# Patient Record
Sex: Male | Born: 1955 | Race: White | Hispanic: No | Marital: Married | Smoking: Never smoker
Health system: Southern US, Community
[De-identification: ages and names within clinical notes are randomized; demographics above are authoritative.]

## PROBLEM LIST (undated history)

## (undated) DIAGNOSIS — Z9289 Personal history of other medical treatment: Secondary | ICD-10-CM

## (undated) DIAGNOSIS — I2510 Atherosclerotic heart disease of native coronary artery without angina pectoris: Secondary | ICD-10-CM

## (undated) DIAGNOSIS — E785 Hyperlipidemia, unspecified: Secondary | ICD-10-CM

## (undated) HISTORY — DX: Hyperlipidemia, unspecified: E78.5

## (undated) HISTORY — DX: Personal history of other medical treatment: Z92.89

## (undated) HISTORY — DX: Atherosclerotic heart disease of native coronary artery without angina pectoris: I25.10

---

## 2001-06-09 ENCOUNTER — Emergency Department (HOSPITAL_COMMUNITY): Admission: EM | Admit: 2001-06-09 | Discharge: 2001-06-09 | Payer: Self-pay | Admitting: Internal Medicine

## 2002-07-28 HISTORY — PX: CORONARY ARTERY BYPASS GRAFT: SHX141

## 2002-10-05 ENCOUNTER — Inpatient Hospital Stay (HOSPITAL_COMMUNITY): Admission: AD | Admit: 2002-10-05 | Discharge: 2002-10-16 | Payer: Self-pay | Admitting: Cardiovascular Disease

## 2002-10-05 ENCOUNTER — Encounter: Payer: Self-pay | Admitting: Cardiovascular Disease

## 2002-10-10 ENCOUNTER — Encounter: Payer: Self-pay | Admitting: Surgery

## 2002-10-11 ENCOUNTER — Encounter: Payer: Self-pay | Admitting: Surgery

## 2002-10-12 ENCOUNTER — Encounter: Payer: Self-pay | Admitting: Surgery

## 2002-11-08 ENCOUNTER — Encounter: Admission: RE | Admit: 2002-11-08 | Discharge: 2002-11-08 | Payer: Self-pay | Admitting: Surgery

## 2002-11-08 ENCOUNTER — Encounter: Payer: Self-pay | Admitting: Surgery

## 2002-11-14 ENCOUNTER — Encounter (HOSPITAL_COMMUNITY): Admission: RE | Admit: 2002-11-14 | Discharge: 2002-12-14 | Payer: Self-pay | Admitting: Cardiovascular Disease

## 2002-12-16 ENCOUNTER — Encounter (HOSPITAL_COMMUNITY): Admission: RE | Admit: 2002-12-16 | Discharge: 2003-01-15 | Payer: Self-pay | Admitting: Cardiovascular Disease

## 2003-01-18 ENCOUNTER — Encounter (HOSPITAL_COMMUNITY): Admission: RE | Admit: 2003-01-18 | Discharge: 2003-02-17 | Payer: Self-pay | Admitting: Cardiovascular Disease

## 2003-11-27 ENCOUNTER — Ambulatory Visit (HOSPITAL_COMMUNITY): Admission: RE | Admit: 2003-11-27 | Discharge: 2003-11-27 | Payer: Self-pay | Admitting: Family Medicine

## 2006-09-21 ENCOUNTER — Ambulatory Visit (HOSPITAL_COMMUNITY): Admission: RE | Admit: 2006-09-21 | Discharge: 2006-09-21 | Payer: Self-pay | Admitting: Internal Medicine

## 2006-09-21 ENCOUNTER — Ambulatory Visit: Payer: Self-pay | Admitting: Internal Medicine

## 2008-07-10 ENCOUNTER — Ambulatory Visit (HOSPITAL_COMMUNITY): Admission: RE | Admit: 2008-07-10 | Discharge: 2008-07-10 | Payer: Self-pay | Admitting: Family Medicine

## 2008-07-10 ENCOUNTER — Encounter: Payer: Self-pay | Admitting: Orthopedic Surgery

## 2008-07-19 ENCOUNTER — Ambulatory Visit (HOSPITAL_COMMUNITY): Admission: RE | Admit: 2008-07-19 | Discharge: 2008-07-19 | Payer: Self-pay | Admitting: Family Medicine

## 2008-07-31 ENCOUNTER — Ambulatory Visit: Payer: Self-pay | Admitting: Orthopedic Surgery

## 2008-07-31 DIAGNOSIS — M25569 Pain in unspecified knee: Secondary | ICD-10-CM | POA: Insufficient documentation

## 2008-07-31 DIAGNOSIS — M23302 Other meniscus derangements, unspecified lateral meniscus, unspecified knee: Secondary | ICD-10-CM | POA: Insufficient documentation

## 2008-07-31 DIAGNOSIS — M25469 Effusion, unspecified knee: Secondary | ICD-10-CM | POA: Insufficient documentation

## 2008-08-30 ENCOUNTER — Ambulatory Visit: Payer: Self-pay | Admitting: Orthopedic Surgery

## 2010-12-13 NOTE — Op Note (Signed)
NAME:  Strickland, William             ACCOUNT NO.:  [id number]   MEDICAL RECORD NO.:  [id number]          PATIENT TYPE:  AMB   LOCATION:  DAY                           FACILITY:  [facility]   PHYSICIAN:  R. Roetta Sessions, M.D. DATE OF BIRTH:  [birth year]   DATE OF PROCEDURE:  DATE OF DISCHARGE:                               OPERATIVE REPORT   DATE OF PROCEDURE:  September 21, 2006   INDICATIONS FOR PROCEDURE:  A 55 year old Caucasian male with no lower  GI tract symptoms sent over courtesy of Dr. Thalia Party to have colorectal  cancer screening.  He has never had his lower GI tract imaged and has  family history colorectal cancer.  Colonoscopy is now being done as  screening maneuver.  This approach has been discussed with the patient  at length.  Potential risks, benefits and alternatives have been  reviewed, questions answered.  Please see documentation in the medical  record.   PROCEDURE NOTE:  O2 saturation, blood pressure, pulse and respirations  were monitored during the entire procedure.  Conscious sedation with  Versed 4 milligrams IV and Demerol 100 milligrams IV in divided doses.   INSTRUMENT USED:  Pentax video chip system.   FINDINGS:  Digital rectal exam revealed no abnormalities.  The prep was  good.  The scope was advanced from the rectosigmoid junction through the  left transverse right colon  __________  ileocecal valve and cecum.  These structures well seen and photographed for the record.  From this  level the scope was slowly and cautiously withdrawn.  All previously  mentioned mucosal surfaces were again seen.  The colonic mucosa appeared  normal.  Scope was then pulled down the rectum with thorough examination  of the rectal mucosa.  Retroflex view of the anal verge revealed no  abnormalities.  Therapeutic/diagnostic maneuvers performed:  None.   The patient tolerated procedure well and was reactive during the  endoscopy.   IMPRESSION:  1. Normal rectum.  2. Normal  colon.   RECOMMENDATIONS:  Repeat screening colonoscopy in 10 years.      Jonathon Bellows, M.D.  Electronically Signed     RMR/MEDQ  D:  09/21/2006  T:  09/21/2006  Job:  [id number]   cc:   [name], MD  Fax: [phone]

## 2010-12-13 NOTE — Op Note (Signed)
NAME:  William Strickland, William Strickland                       ACCOUNT NO.:  [id number]   MEDICAL RECORD NO.:  [id number]                   PATIENT TYPE:  OIB   LOCATION:  2305                                 FACILITY:  [facility]   PHYSICIAN:  Evelene Croon, M.D.                  DATE OF BIRTH:  1956-04-07   DATE OF PROCEDURE:  10/10/2002  DATE OF DISCHARGE:                                 OPERATIVE REPORT   PREOPERATIVE DIAGNOSIS:  Severe three-vessel coronary artery disease with  unstable angina.   POSTOPERATIVE DIAGNOSIS:  Severe three-vessel coronary artery disease with  unstable angina.   PROCEDURES:  1. Median sternotomy.  2. Extracorporeal circulation.  3. Coronary artery bypass graft surgery x5 using a left internal mammary     artery graft to the left anterior descending coronary artery, a free     right internal mammary artery graft to the obtuse marginal branch of the     left circumflex coronary artery, a saphenous vein graft to the diagonal     branch of the left anterior descending, and a sequential saphenous vein     graft to the posterior descending and posterolateral branches of the     right coronary artery.  4. Endoscopic vein harvesting from the right leg.   SURGEON:  Evelene Croon, M.D.   ASSISTANT:  Toribio Harbour, R.N.   ANESTHESIA:  General endotracheal.   CLINICAL HISTORY:  This patient is a 55 year old gentleman with a history of  dyslipidemia and a very strong family history of premature coronary artery  disease, who presented with unstable anginal symptoms.  He underwent a  stress Cardiolite exam on 10/04/02, which was markedly positive.  He underwent  cardiac catheterization, which showed severe three-vessel disease.  There  was a long 90% mid-LAD stenosis.  There was a takeoff of the diagonal branch  in this area, which had about 90% stenosis.  The left circumflex gave off a  small first marginal and had about 80% stenosis and then a large second  marginal that  had 95% stenosis.  The right coronary artery was a large,  dominant vessel that had 99% midvessel stenosis.  There were 60 and 75%  distal stenoses before the posterior descending branch.  The posterior  descending branch itself had 70% proximal stenosis.  The distal right  coronary artery beyond the posterior descending had 95% stenosis before two  posterolateral branches, which were filled by collaterals from the left  circumflex.  Left ventricular ejection fraction was normal.  After review of  the angiogram and examination of the patient, it was felt that coronary  artery bypass graft surgery was the best treatment for this patient.  I felt  that it would be best to use bilateral internal mammary grafts if possible.  I discussed the operation with the patient and his wife and daughter,  including alternatives, benefits, and risks, including bleeding, blood  transfusion, infection, stroke, myocardial infarction, and death.  I also  discussed the increased incidence of sternal wound healing problems if we  used bilateral internal mammary grafts, although I felt this risk would be  minimally elevated since he was a nonsmoker, not diabetic, and not obese.  They understood and agreed with this plan.   DESCRIPTION OF PROCEDURE:  The patient was taken to the operating room and  placed on the table in the supine position.  After induction of general  endotracheal anesthesia, a Foley catheter was placed in the bladder using  sterile technique.  Then the chest, abdomen, and both lower extremities were  prepped and draped in the usual sterile manner.  The chest was entered  through a median sternotomy incision and the pericardium opened in the  midline.  Examination of the heart showed good ventricular contractility.  The ascending aorta had no palpable plaques in it.   Then the left internal mammary artery was harvested from the chest wall as a  pedicle graft.  This was a medium to  large-caliber vessel with excellent  blood flow through it.  Then the right internal mammary artery was harvested  as a pedicle graft.  This artery was also of medium to large caliber and  good quality.  I did not feel that the length would be sufficient to reach  over to the obtuse marginal branch, even going through the transverse sinus.  I did measure this distance and it would not be long enough as a pedicle  graft and therefore was harvested as a free graft.  At the same time a  segment of greater saphenous vein was harvested from the right leg using  endoscopic vein harvest technique.  This vein was of large caliber and good  quality.   Then the patient was heparinized and when an adequate activated clotting  time was achieved, the distal ascending aorta was cannulated using a 20  French aortic cannula for arterial inflow.  Venous outflow was achieved  using a two-stage venous cannula through the right atrial appendage.  An  antegrade cardioplegia and vent cannula was inserted in the aortic root.   The patient was placed on cardiopulmonary bypass and the distal coronary  arteries identified.  The LAD was a large, graftable vessel that was heavily  diseased in its proximal portion.  This disease extended down into the  midportion.  The patient had three diagonal branches.  The first and third  were small, and the second one was large enough to graft.  The obtuse  marginal branch was a large vessel that had some proximal disease in it.  The first marginal was a tiny, nongraftable vessel.  The right coronary  artery gave off medium-sized posterior descending and posterolateral  branches.   Then the aorta was crossclamped and 500 mL of cold blood antegrade  cardioplegia was administered in the aortic root with quick arrest of the  heart.  Systemic hypothermia to 28 degrees Centigrade and topical hypothermia with iced saline was used.  A temperature probe was placed in  the septum and  an insulating pad in the pericardium.   The first distal anastomosis was performed to the posterior descending  coronary artery.  The internal diameter was 1.75 mm.  The conduit used was a  segment of greater saphenous vein and the anastomosis performed in a  sequential Steri-Strips manner using continuous 7-0 Prolene suture.  Flow  was measured through the graft and was excellent.  The second distal anastomosis was performed to the posterolateral branch.  The internal diameter was about 1.6 mm.  The conduit used was the same  segment of greater saphenous vein and the anastomosis performed in a  sequential end-to-side manner using continuous 7-0 Prolene suture.  Flow was  measured through the graft and was excellent.  Then a dose of cardioplegia  was given down this vein graft and in the aortic root.   The third distal anastomosis was performed to the obtuse marginal branch.  The internal diameter was about 2 mm.  The conduit used was the free right  internal mammary graft, and this was anastomosed in an end-to-side manner  using continuous 8-0 Prolene suture.  The pedicle was tacked to the  epicardium with 6-0 Prolene sutures.   The fourth distal anastomosis was performed to the diagonal branch.  The  internal diameter of the second diagonal was about 1.6 mm.  The conduit used  was the second segment of greater saphenous vein and the anastomosis  performed in an end-to-side manner using continuous 7-0 Prolene suture.  Flow was measured through the graft and was excellent.  Then another dose of  cardioplegia was given down the vein grafts and in the aortic root.   The fifth distal anastomosis was performed to the distal portion of the left  anterior descending coronary artery.  The internal diameter was about 2 mm.  The conduit used was the left internal mammary graft, and this was brought  through an opening in the left pericardium anterior to the phrenic nerve.  It was anastomosed  to the LAD in an end-to-side manner using continuous 8-0  Prolene suture.  The pedicle was tacked to the epicardium with 6-0 Prolene  sutures.  The patient was rewarmed to 37 degrees Centigrade and the clamp  removed from the mammary pedicle.  There was rapid warming of the  ventricular septum and return of spontaneous ventricular fibrillation.  The  crossclamp was removed with a time of 81 minutes and the patient  defibrillated into sinus rhythm.   The partial occlusion clamp was placed in the aortic root and the two  proximal vein graft anastomoses were performed in an end-to-side manner  using continuous 6-0 Prolene suture.  The proximal anastomosis of the right  internal mammary free graft was performed to the diagonal vein graft in an  end-to-side manner using continuous 7-0 Prolene suture.  The clamp was  removed, the vein graft de-aired, and the clamps removed from them.  The proximal and distal anastomoses appeared hemostatic and the alignment of the  grafts satisfactory.  Graft markers were placed around the proximal  anastomoses.  Two temporary right ventricular and right atrial pacing wires  were placed and brought out through the skin.   When the patient had rewarmed to 37 degrees Centigrade, he was weaned from  cardiopulmonary bypass on no inotropic agents.  Total bypass time was 151  minutes.  Cardiac function appeared excellent with a cardiac output of 7  L/min.  Protamine was given and the venous and aortic cannulas were removed  without difficulty.  Hemostasis was achieved.  Four chest tubes were placed  with bilateral pleural tubes, a tube in the posterior pericardium, and one  in the anterior mediastinum.  The pericardium was reapproximated over the  heart.  The sternum closed with #6 stainless steel wires.  The fascia was  closed with continuous #1 Vicryl suture.  The subcutaneous tissue was closed  using  continuous 2-0 Vicryl and the skin with 3-0 Vicryl  subcuticular  closure.  The lower extremity vein harvest site was closed in layers in a  similar manner.  The sponge, needle, and instrument counts were correct  according to the scrub nurse.  Dry sterile dressings were applied over the  incisions and around the chest tubes, which were hooked to Pleuravac  suction.  The patient remained hemodynamically stable and was transported to  the SICU in guarded but stable condition.                                               Evelene Croon, M.D.    BB/MEDQ  D:  10/10/2002  T:  10/11/2002  Job:  [id number]   cc:   Nanetta Batty, M.D.  [address]  [place]  [place] [address]  Fax: [phone]   [facility]

## 2010-12-13 NOTE — Cardiovascular Report (Signed)
NAME:  William Strickland, William Strickland NO.:  [id number]   MEDICAL RECORD NO.:  [id number]                   PATIENT TYPE:  OIB   LOCATION:  2899                                 FACILITY:  [facility]   PHYSICIAN:  Nanetta Batty, M.D.                DATE OF BIRTH:  1956/07/11   DATE OF PROCEDURE:  10/05/2002  DATE OF DISCHARGE:                              CARDIAC CATHETERIZATION   PROCEDURE PERFORMED:  Cardiac catheterization.   INDICATION:  The patient is a 55 year old married white male, who has had  accelerated angina and a positive Cardiolite stress test.  He has a strong  family history of heart disease.  He presents now for diagnostic coronary  arteriography.   DESCRIPTION OF PROCEDURE:  The patient was brought to the second floor [facility] in the postabsorptive state.  He was  premedicated with p.o. Valium and IV Versed.  His right groin was prepped  and shaved in the usual sterile fashion.  Xylocaine 1% was used for local  anesthesia.  A 6 French sheath was inserted into the right femoral artery  using standard Seldinger technique. A 6 French right and left Judkins  diagnostic catheter as well as a 6 French pigtail catheter were used for  selective coronary angiography, left ventriculography, subselective right  and left internal mammary artery angiography, and distal abdominal  aortography.  Omnipaque dye was used for the entirety of the case.  Retrograde, aortic, left ventricular, and pullback pressures were recorded.   HEMODYNAMICS:  1. Aortic systolic pressure 111, diastolic pressure 74.  2. Left ventricular systolic pressure 111, diastolic pressure 11.   SELECTIVE CORONARY ANGIOGRAPHY:  1. Left main:  Normal.  2. Left anterior descending:  The LAD had diffuse segmental 60% mid stenosis     with focal 80% at the distal edge of the disease segment.  A second     diagonal branch is a small vessel and had 90% ostial  stenosis.  3. Ramus intermedius branch:  This is a large bifurcating vessel that had a     95% proximal stenosis.  4. Right coronary artery:  This is a large dominant vessel with a 99%     stenosis to the midportion with a proximal filling defect followed by 60-     75% segmental stenosis distally.  There was a 70% proximal segmental PDA     stenosis and 95% proximal PLA stenosis.  There was left to right     collaterals.   LEFT VENTRICULOGRAM:  RAO left ventriculogram was performed using 25 mL of  Omnipaque dye at 12 mL/sec.  The overall LVEF was estimated at greater than  60% without focal wall motion abnormalities.   Right and left internal mammary arteries:  These were subselectively  visualized and were widely patent.  Both were suitable for coronary artery  bypass grafting.   DISTAL AORTOGRAPHY:  This  was performed using 20 mL of Omnipaque at 20  mL/sec.  The renal arteries are widely patent.  The infrarenal abdominal  aorta and iliac bifurcation appeared free of significant atherosclerotic  changes.   IMPRESSION:  The patient has diffuse coronary artery disease involving all  three vessels with preserved left ventricular function, accelerated angina,  positive Cardiolite stress test.   He will need coronary artery bypass grafting for complete revascularization.  He is a good candidate for a complete arterial conduit operation.   The sheaths were removed and pressure was held on the groin to achieve  hemostasis.  The patient left the lab in stable condition. [facility] was notified  and Drs. Laneta Simmers and Ripley were requested.  Dr. Renard Matter was notified as  well.                                                  Nanetta Batty, M.D.    Cordelia Pen  D:  10/05/2002  T:  10/05/2002  Job:  [id number]   cc:   Cardiac Catheterization Lab   [name], M.D.  [address]  [place]  [place] [address]  Fax: [phone]   [facility]  [address]  [place], [place] [address]

## 2010-12-13 NOTE — Discharge Summary (Signed)
NAME:  William Strickland, William Strickland                       ACCOUNT NO.:  [id number]   MEDICAL RECORD NO.:  [id number]                   PATIENT TYPE:  OIB   LOCATION:  2002                                 FACILITY:  [facility]   PHYSICIAN:  Evelene Croon, M.D.                  DATE OF BIRTH:  23-Mar-1956   DATE OF ADMISSION:  10/05/2002  DATE OF DISCHARGE:  10/16/2002                                 DISCHARGE SUMMARY   ADDITIONAL DIAGNOSIS:  Coronary artery disease.   PAST MEDICAL HISTORY:  1. History of chest pain followed with regular stress testing.  Last one     12/00 was negative.  2. Dyslipidemia.  3. Strong family history of coronary artery disease.   ALLERGIES:  No known drug allergies.   DISCHARGE DIAGNOSIS:  Severe two vessel coronary artery disease with  unstable angina status post coronary artery bypass grafting.   HISTORY OF PRESENT ILLNESS:  The patient is a 55 year old Caucasian male.  He returned to [facility] on 09/30/02  after several episodes of chest tightness.  Prior to this visit, he had last  seen Dr. Allyson Sabal in 12/00 when he had a normal stress test.  He was scheduled  for a Cardiolite stress test which was performed on 10/04/02.  He was able to  finish the exercise portion of the test but complained of pain being 6 on a  scale from 0-10.  He was evaluated that day by Dr. Nicki Guadalajara who  recommended admission to [facility] for elective coronary  angiography.   HOSPITAL COURSE:  On 10/04/02, the patient was admitted to [facility]  under the care of Dr. Nanetta Batty.  He underwent a cardiac  catheterization on 10/05/02 which revealed severe three vessel coronary  artery disease.  As these lesions were not amenable to PCI, cardiac surgery  consult was requested.  He was evaluated later in the day by Dr. Evelene Croon.  After examination of patient and review of the patient's charts,  including cardiac catheterization films,  Dr. Laneta Simmers recommended proceeding  with coronary artery bypass grafting as the best treatment for this young  man.  Procedure, risks and benefits were discussed with the patient, and he  agreed with this plan.  He was scheduled for surgery on 10/10/02.   Preoperative Doppler studies revealed no significant carotid artery disease,  his ABI's were greater than 1.0 bilaterally.  He remained stable in the  hospital on heparin drip until surgery.   On 10/10/02, the patient underwent the following surgical procedures with Dr.  Evelene Croon:  Coronary artery bypass grafting x5.  Grafts placed at the  time of the procedure were a left internal mammary arteriograph to the left  anterior descending artery.  Free right internal mammary arteriograph to the  circumflex artery, saphenous vein graft to the diagonal artery, saphenous  vein graft in the  sequential fashion to the posterior descending and  posterior lateral arteries.  Vein was harvested from the right thigh with  endovenous harvesting technique.  He tolerated this procedure well and was  transferred in stable condition to the surgical intensive care unit.  He  remained hemodynamically stable in immediate postoperative period.  He was  extubated several hours after arrival in the [facility] ICU.    POSTOPERATIVE DIAGNOSES:  1. Postoperative rapid atrial fibrillation.  This first occurred on 10/12/02.     This is postoperative day #2.  His rate was in the 160-190 range.     Amiodarone was initiated.  He converted to normal sinus rhythm on IV     Amiodarone and Lopressor.  However, he had several episodes of back and     forth between atrial fibrillation and normal sinus rhythm.  For this     reason, he was started on anticoagulation with Lovenox.  This was     eventually changed over to Coumadin.  He converted to normal sinus rhythm     on 3/18 and has maintained that rhythm since.  2. Pericardial rub was noticed on 10/13/02, postop day #3.  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  3. The morning of 10/15/02, postop day #5, the patient reports feeling very     well.  His vital signs are stable with blood pressure of 100/55,     afebrile.  His room air saturations are 99%.  His heart is in normal     sinus rhythm at approximately 77 beats per minute.  No pericardial rub is     noted.  His lungs are clear.  He is tolerating his diet well.  His bowel     and bladder functions are within normal limits for him.  His incisions     are all healing well.  He does have quite a bit of ecchymosis of his     right thigh.  He is ambulating independently in his room. H is pain is     well controlled.  He is sleeping okay.  The patient is making very good     progress and recovering from his surgery.  If he continues this progress,     it is anticipated he will be ready for discharge home tomorrow 10/16/02.   LABORATORY DATA:  On 10/15/02, chemistries revealed sodium 135, potassium  3.8, BUN 18, creatinine 1.0, glucose 106, calcium 8.4.  PT 17.1, INR 1.5.  CBC on 3/17 revealed white blood cell counts 11.2, hemoglobin 11.3,  hematocrit 32.9, platelets 208.   CONDITION ON DISCHARGE:  Improved.   DISCHARGE MEDICATIONS:  1. Amiodarone 200 mg p.o. daily.  2. Coumadin discharge dose to be determined prior to discharge.  3. Zocor 40 mg p.o. daily.  4. Lopressor 25 mg p.o. q.12h.  5. He is to resume his aspirin 81 mg p.o. daily.  6. Tylox 1-2 p.o. q.4-6h. p.r.n. moderate to severe pain.  7. Tylenol 325 mg 1-2 p.o. q.4-6h. p.r.n. mild pain.   ACTIVITY:  He has been asked to refrain from any driving or any heavy  lifting, pushing or pulling.  He is asked to continue his preadmission  exercises and daily walking.   DIET:  Low-fat, low-salt diet.   WOUND CARE:  He may shower with mild soap and water.  If his incision becomes red, hot, swollen, draining or develops a fever greater  than 100.1,   he is to call Dr. Sharee Pimple office.   FOLLOWUP:  He will have PT/INR blood work checked at [facility] Tuesday 3/23.  He will be asked to call to arrange that  appointment time.  Dr. Allyson Sabal would like to see him at the office in  approximately two weeks.  He is also managed to call to arrange that  appointment.  Dr. Laneta Simmers would like to see him at the CVTS office in three  weeks.  Our office will call with a date and time for that appointment.  He  will be asked to have a chest x-ray and 2-D echo one hour prior to his  appointment.     Toribio Harbour, R.N.                  Evelene Croon, M.D.    CTK/MEDQ  D:  10/15/2002  T:  10/17/2002  Job:  [id number]   cc:   [name], M.D.  [address]  [place]  [place] [address]  Fax: [phone]

## 2010-12-13 NOTE — Consult Note (Signed)
NAME:  William Strickland, William Strickland                         ACCOUNT NO.:  [id number]   MEDICAL RECORD NO.:  [id number]                   PATIENT TYPE:  OIB   LOCATION:  4737                                 FACILITY:  [facility]   PHYSICIAN:  Evelene Croon, M.D.                  DATE OF BIRTH:  September 22, 1955   DATE OF CONSULTATION:  10/05/2002  DATE OF DISCHARGE:                                   CONSULTATION   REASON FOR CONSULTATION:  Severe three-vessel coronary artery disease.   HISTORY OF PRESENT ILLNESS:  The patient is a 55 year old gentleman with a  history of dyslipidemia and a very strong family history of premature  coronary artery disease who recently developed chest tightness.  This was  substernal and severe and associated with shortness of breath.  He rated the  pain as 5/10.  He took four baby aspirin and the pain resolved.  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  This was associated with shortness of breath.  He  continued working and it eventually went away.  He continued to have  intermittent episodes of pain the following day.  He was seen in Dr. Hazle Coca  office on 09/30/02.  He was scheduled for a Cardiolite scan on 10/04/02 which  was positive with ST depression and shortness of breath as well as some  chest pain rated 6/10.  He was admitted and underwent coronary angiography  today which showed severe three-vessel coronary disease.  There is a long  90% mid LAD stenosis.  There is a take-off of a diagonal branch in this area  with about 90% stenosis.  The left circumflex gave off a small first  marginal that had about 80% stenosis and then a large second marginal which  had 95% stenosis.  The right coronary artery was a large dominant vessel  that had 99% mid vessel stenosis.  There were 60 and 75%  distal stenoses  before the posterior descending branch.  The posterior descending had 70%  proximal stenosis.  The distal right  beyond the posterior descending had 95%  stenosis before to posterior lateral branches which were filled by  collaterals from the left circumflex.  Left ventricular ejection fraction  was normal.  Both internal mammary arteries were large vessels.   MEDICATIONS:  1. Aspirin 81 mg daily.  2. Nitroglycerin p.r.n.  3. Prevacid 30 mg daily.  4. Vitamin E 400 units daily.   ALLERGIES:  None.   FAMILY HISTORY:  Strongly positive for premature coronary disease.  Essentially, all of his male relatives have had coronary disease and either  died of myocardial infarction or had coronary bypass surgery.  One brother  age 68 had coronary bypass surgery.  His mother died of heart attack at age  38.  One cousin died of myocardial infarction at age 38 and uncle  died of  myocardial infarction at 36.   PAST MEDICAL HISTORY:  He has a history of chest pain in the past and has  been followed with regular stress testing.  The last one was in 12/00 and  was apparently negative.  He had not been seen since that.  He has had no  previous hospitalizations. He does have dyslipidemia.   SOCIAL HISTORY:  He is married and works as a Land.  He has three  children.  He exercises every day on a treadmill.  He has never smoked and  does not abuse alcohol.   REVIEW OF SYSTEMS:  GENERAL:  He has had some decrease in his energy level.  He denies any fever or chills.  He has had no weight loss.  EYES:  Negative.  ENT:  Negative.  ENDOCRINE:  He denies diabetes and hypothyroidism.  CARDIOVASCULAR:  As above.  He has had no PND or orthopnea.  He has had  exertional chest pain and shortness of breath.  RESPIRATORY:  He denies  cough and sputum production.  GI:  He has had no nausea or vomiting.  Denies  melena and bright red blood per rectum.  GU:  He denies dysuria and  hematuria.  NEUROLOGIC:  He has had no focal weakness or numbness.  He  denies dizziness and syncope.  MUSCULOSKELETAL:  He has had no  history of  arthralgias or myalgias.  VASCULAR:  He has had no claudication or  phlebitis.  PSYCHIATRIC:  Negative.   PHYSICAL EXAMINATION:  VITAL SIGNS:  Blood pressure 125/65 and  his pulse is  72 and regular. Respiratory rate 16, unlabored.  GENERAL:  He is a large, muscular, well-developed white male in no distress.  HEENT:  Shows normocephalic, atraumatic.  Pupils are equal and reactive to  light and accommodation.  Extraocular muscles are intact.  His throat is  clear.  NECK:  Shows normal carotid pulses bilaterally.  There are no bruits.  There  is no adenopathy or thyromegaly.  CARDIAC:  Regular  rate and rhythm with normal S1 and S2.  There is no  murmurs, rubs or gallops.  LUNGS:  Clear.  ABDOMEN:  Active bowel sounds.  His abdomen is soft and nontender.  There  are no palpable masses or organomegaly.  EXTREMITIES:  No peripheral edema.  Pedal pulses are palpable bilaterally.  NEUROLOGIC:  Alert and oriented x3.  Motor and sensory exam is grossly  normal.  SKIN:  Warm and dry.   IMPRESSION:  The patient has severe three-vessel coronary disease with  unstable angina and positive stress test.  I agree that coronary artery  bypass graft surgery  using both internal mammary arteries is the best  treatment for this young patient.  I discussed the operative procedure with  he and his family including alternatives, benefits, and risks including  bleeding, blood transfusion, infection, stroke, myocardial infarction, and  death.  I also discussed the possibility of sternal nonunion and wound  healing problems due to using both internal mammary arteries although I  think that risk is very low in this otherwise healthy patient.  They  understand all this and agree to proceed.  We will plan to do surgery on  Monday 10/10/02.  Evelene Croon, M.D.   BB/MEDQ  D:  10/05/2002  T:  10/06/2002  Job:  [id number]   cc:   [name], M.D.   [address]  [place]  [place] [address]  Fax: [phone]

## 2012-12-21 ENCOUNTER — Other Ambulatory Visit: Payer: Self-pay | Admitting: *Deleted

## 2012-12-21 MED ORDER — NIACIN ER (ANTIHYPERLIPIDEMIC) 1000 MG PO TBCR: 1000.0000 mg | EXTENDED_RELEASE_TABLET | Freq: Every day | ORAL | Status: DC

## 2012-12-21 NOTE — Telephone Encounter (Signed)
RX for niacin

## 2013-01-10 ENCOUNTER — Other Ambulatory Visit: Payer: Self-pay | Admitting: *Deleted

## 2013-01-10 MED ORDER — ATORVASTATIN CALCIUM 80 MG PO TABS: 80.0000 mg | ORAL_TABLET | Freq: Every day | ORAL | Status: DC

## 2013-02-02 ENCOUNTER — Encounter: Payer: Self-pay | Admitting: Cardiovascular Disease

## 2013-02-02 ENCOUNTER — Ambulatory Visit (INDEPENDENT_AMBULATORY_CARE_PROVIDER_SITE_OTHER): Payer: BC Managed Care – PPO | Admitting: Cardiovascular Disease

## 2013-02-02 VITALS — BP 122/78 | HR 92 | Ht 72.0 in | Wt 249.2 lb

## 2013-02-02 DIAGNOSIS — E785 Hyperlipidemia, unspecified: Secondary | ICD-10-CM

## 2013-02-02 DIAGNOSIS — I2510 Atherosclerotic heart disease of native coronary artery without angina pectoris: Secondary | ICD-10-CM | POA: Insufficient documentation

## 2013-02-02 NOTE — Progress Notes (Signed)
02/02/2013 William Strickland   04-28-1956  [id number]  Primary Physician Alice Reichert, MD Primary Cardiologist: [name] MD Roseanne Reno   HPI:  William Strickland is a 57 year old mildly overweight widowed Caucasian male father of 3 children, grandfather to 6 grandchildren who I last saw in the office/18/13. He has a history of CAD status post coronary artery bypass grafting in 2004 with an LIMA graft to the LAD, RIMA graft to an OM branch, a vein to diagonal branch, and a sequential vein to the PDA/PL A. His other problems include hyperlipidemia. He had an excellent lipid profile May of this year. He had a nonischemic Myoview in November of last year. He gets occasional atypical chest pain.   Current Outpatient Prescriptions  Medication Sig Dispense Refill  . aspirin 81 MG tablet Take 81 mg by mouth daily.      Marland Kitchen atorvastatin (LIPITOR) 80 MG tablet Take 1 tablet (80 mg total) by mouth daily.  30 tablet  1  . metoprolol (LOPRESSOR) 50 MG tablet Take 25 mg by mouth 2 (two) times daily.      . niacin (NIASPAN) 1000 MG CR tablet Take 1 tablet (1,000 mg total) by mouth at bedtime.  90 tablet  0  . VIAGRA 100 MG tablet Take 100 mg by mouth as needed.       No current facility-administered medications for this visit.    No Known Allergies  History   Social History  . Marital Status: Widowed    Spouse Name: N/A    Number of Children: 3  . Years of Education: N/A   Occupational History  . Not on file.   Social History Main Topics  . Smoking status: Never Smoker   . Smokeless tobacco: Never Used  . Alcohol Use: No  . Drug Use: No  . Sexually Active: Yes -- Male partner(s)   Other Topics Concern  . Not on file   Social History Narrative  . No narrative on file     Review of Systems: General: negative for chills, fever, night sweats or weight changes.  Cardiovascular: negative for chest pain, dyspnea on exertion, edema, orthopnea, palpitations, paroxysmal  nocturnal dyspnea or shortness of breath Dermatological: negative for rash Respiratory: negative for cough or wheezing Urologic: negative for hematuria Abdominal: negative for nausea, vomiting, diarrhea, bright red blood per rectum, melena, or hematemesis Neurologic: negative for visual changes, syncope, or dizziness All other systems reviewed and are otherwise negative except as noted above.    Blood pressure 122/78, pulse 92, height 6' (1.829 m), weight 249 lb 3.2 oz (113.036 kg).  General appearance: alert and no distress Neck: no adenopathy, no carotid bruit, no JVD, supple, symmetrical, trachea midline and thyroid not enlarged, symmetric, no tenderness/mass/nodules Lungs: clear to auscultation bilaterally Heart: regular rate and rhythm, S1, S2 normal, no murmur, click, rub or gallop Extremities: extremities normal, atraumatic, no cyanosis or edema  EKG normal sinus rhythm at 92 with nonspecific ST and T wave changes  ASSESSMENT AND PLAN:   Coronary artery disease Status post coronary artery bypass grafting in 2004 with an LIMA graft to his LAD, patent RIMA graft to an OM branch, a vein to the diagonal branch and a sequential vein to the PDA/PL A. He had a nuclear stress test performed in November of last year which was normal suggesting continued graft patency. Complaints of atypical/noncardiac chest pain occurs several times a month.  Hyperlipidemia On statin therapy with excellent profile performed as recently as  12/08/12 with a total cholesterol of 100, LDL of 43 and HDL of 32.      [name] MD [facility], [facility] 02/02/2013 10:39 AM

## 2013-02-02 NOTE — Assessment & Plan Note (Signed)
Status post coronary artery bypass grafting in 2004 with an LIMA graft to his LAD, patent RIMA graft to an OM branch, a vein to the diagonal branch and a sequential vein to the PDA/PL A. He had a nuclear stress test performed in November of last year which was normal suggesting continued graft patency. Complaints of atypical/noncardiac chest pain occurs several times a month.

## 2013-02-02 NOTE — Assessment & Plan Note (Signed)
On statin therapy with excellent profile performed as recently as 12/08/12 with a total cholesterol of 100, LDL of 43 and HDL of 32.

## 2013-02-02 NOTE — Patient Instructions (Addendum)
Your physician recommends that you schedule a follow-up appointment in: 1 year  

## 2013-02-03 ENCOUNTER — Encounter: Payer: Self-pay | Admitting: Cardiovascular Disease

## 2013-03-25 ENCOUNTER — Other Ambulatory Visit: Payer: Self-pay | Admitting: *Deleted

## 2013-03-25 MED ORDER — ATORVASTATIN CALCIUM 80 MG PO TABS: 80.0000 mg | ORAL_TABLET | Freq: Every day | ORAL | Status: DC

## 2013-03-25 NOTE — Telephone Encounter (Signed)
Rx was sent to pharmacy electronically. 

## 2013-04-07 ENCOUNTER — Other Ambulatory Visit: Payer: Self-pay | Admitting: *Deleted

## 2013-04-07 MED ORDER — NIACIN ER (ANTIHYPERLIPIDEMIC) 1000 MG PO TBCR: 1000.0000 mg | EXTENDED_RELEASE_TABLET | Freq: Every day | ORAL | Status: DC

## 2013-04-07 NOTE — Telephone Encounter (Signed)
Niacin refilled electronically #90 w/1 refill.

## 2013-06-27 HISTORY — PX: CARDIAC CATHETERIZATION: SHX172

## 2013-07-08 ENCOUNTER — Encounter (HOSPITAL_COMMUNITY): Payer: Self-pay | Admitting: Cardiology

## 2013-07-08 ENCOUNTER — Encounter (HOSPITAL_COMMUNITY)
Admission: AD | Disposition: A | Discharge status: Home / Self Care | Payer: Self-pay | Source: Ambulatory Visit | Attending: Cardiovascular Disease

## 2013-07-08 ENCOUNTER — Encounter: Payer: Self-pay | Admitting: Cardiology

## 2013-07-08 ENCOUNTER — Observation Stay (HOSPITAL_COMMUNITY)
Admission: AD | Admit: 2013-07-08 | Discharge: 2013-07-09 | DRG: 287 | Disposition: A | Discharge status: Home / Self Care | Payer: BC Managed Care – PPO | Source: Ambulatory Visit | Attending: Cardiovascular Disease | Admitting: Cardiovascular Disease

## 2013-07-08 ENCOUNTER — Ambulatory Visit (INDEPENDENT_AMBULATORY_CARE_PROVIDER_SITE_OTHER): Payer: BC Managed Care – PPO | Admitting: Cardiology

## 2013-07-08 VITALS — BP 100/80 | HR 73 | Ht 72.0 in | Wt 253.4 lb

## 2013-07-08 DIAGNOSIS — E785 Hyperlipidemia, unspecified: Secondary | ICD-10-CM | POA: Diagnosis present

## 2013-07-08 DIAGNOSIS — R079 Chest pain, unspecified: Secondary | ICD-10-CM | POA: Diagnosis present

## 2013-07-08 DIAGNOSIS — I200 Unstable angina: Secondary | ICD-10-CM

## 2013-07-08 DIAGNOSIS — Z8249 Family history of ischemic heart disease and other diseases of the circulatory system: Secondary | ICD-10-CM

## 2013-07-08 DIAGNOSIS — Z23 Encounter for immunization: Secondary | ICD-10-CM

## 2013-07-08 DIAGNOSIS — I2510 Atherosclerotic heart disease of native coronary artery without angina pectoris: Secondary | ICD-10-CM

## 2013-07-08 DIAGNOSIS — Z951 Presence of aortocoronary bypass graft: Secondary | ICD-10-CM

## 2013-07-08 DIAGNOSIS — R0789 Other chest pain: Secondary | ICD-10-CM | POA: Diagnosis not present

## 2013-07-08 HISTORY — PX: LEFT HEART CATHETERIZATION WITH CORONARY ANGIOGRAM: SHX5451

## 2013-07-08 LAB — CBC
HCT: 40.8 % (ref 39.0–52.0)
MCH: 27.4 pg (ref 26.0–34.0)
MCHC: 34.3 g/dL (ref 30.0–36.0)
MCV: 79.8 fL (ref 78.0–100.0)
RDW: 12.7 % (ref 11.5–15.5)

## 2013-07-08 LAB — TSH: TSH: 1.195 u[IU]/mL (ref 0.350–4.500)

## 2013-07-08 LAB — BASIC METABOLIC PANEL
BUN: 12 mg/dL (ref 6–23)
CO2: 27 mEq/L (ref 19–32)
Chloride: 102 mEq/L (ref 96–112)
Creatinine, Ser: 0.92 mg/dL (ref 0.50–1.35)
GFR calc non Af Amer: >90 mL/min (ref >90)
Glucose, Bld: 81 mg/dL (ref 70–99)

## 2013-07-08 LAB — PROTIME-INR: INR: 1.13 (ref 0.00–1.49)

## 2013-07-08 LAB — TROPONIN I: Troponin I: <0.3 ng/mL (ref <0.30)

## 2013-07-08 SURGERY — LEFT HEART CATHETERIZATION WITH CORONARY ANGIOGRAM
Anesthesia: LOCAL

## 2013-07-08 MED ORDER — DIAZEPAM 5 MG PO TABS
5.0000 mg | ORAL_TABLET | ORAL | Status: AC
Start: 2013-07-08 — End: 2013-07-08
  Administered 2013-07-08: 5 mg via ORAL
  Filled 2013-07-08: qty 1

## 2013-07-08 MED ORDER — ONDANSETRON HCL 4 MG/2ML IJ SOLN: 4.0000 mg | Freq: Four times a day (QID) | INTRAMUSCULAR | Status: DC | PRN

## 2013-07-08 MED ORDER — ASPIRIN 81 MG PO CHEW
81.0000 mg | CHEWABLE_TABLET | Freq: Every day | ORAL | Status: DC
  Filled 2013-07-08: qty 1

## 2013-07-08 MED ORDER — ALPRAZOLAM 0.25 MG PO TABS: 0.2500 mg | ORAL_TABLET | Freq: Two times a day (BID) | ORAL | Status: DC | PRN

## 2013-07-08 MED ORDER — ASPIRIN 81 MG PO TABS: 81.0000 mg | ORAL_TABLET | Freq: Every day | ORAL | Status: DC

## 2013-07-08 MED ORDER — SODIUM CHLORIDE 0.9 % IJ SOLN: 3.0000 mL | INTRAMUSCULAR | Status: DC | PRN

## 2013-07-08 MED ORDER — ATORVASTATIN CALCIUM 80 MG PO TABS
80.0000 mg | ORAL_TABLET | Freq: Every day | ORAL | Status: DC
  Filled 2013-07-08 (×2): qty 1

## 2013-07-08 MED ORDER — SODIUM CHLORIDE 0.9 % IV SOLN: 250.0000 mL | INTRAVENOUS | Status: DC | PRN

## 2013-07-08 MED ORDER — SODIUM CHLORIDE 0.9 % IV SOLN
1.0000 mL/kg/h | INTRAVENOUS | Status: AC
  Administered 2013-07-08: 1 mL/kg/h via INTRAVENOUS

## 2013-07-08 MED ORDER — ACETAMINOPHEN 325 MG PO TABS
650.0000 mg | ORAL_TABLET | ORAL | Status: DC | PRN
  Administered 2013-07-09: 06:00:00 650 mg via ORAL
  Filled 2013-07-08: qty 2

## 2013-07-08 MED ORDER — NITROGLYCERIN 0.4 MG SL SUBL: 0.4000 mg | SUBLINGUAL_TABLET | SUBLINGUAL | Status: DC | PRN

## 2013-07-08 MED ORDER — NITROGLYCERIN 2 % TD OINT
1.0000 [in_us] | TOPICAL_OINTMENT | Freq: Four times a day (QID) | TRANSDERMAL | Status: DC
  Administered 2013-07-08 – 2013-07-09 (×3): 1 [in_us] via TOPICAL
  Filled 2013-07-08: qty 30

## 2013-07-08 MED ORDER — ASPIRIN 81 MG PO CHEW: 81.0000 mg | CHEWABLE_TABLET | ORAL | Status: DC

## 2013-07-08 MED ORDER — ASPIRIN EC 81 MG PO TBEC: 81.0000 mg | DELAYED_RELEASE_TABLET | Freq: Every day | ORAL | Status: DC

## 2013-07-08 MED ORDER — HEPARIN (PORCINE) IN NACL 100-0.45 UNIT/ML-% IJ SOLN
1400.0000 [IU]/h | INTRAMUSCULAR | Status: DC
  Administered 2013-07-08: 1400 [IU]/h via INTRAVENOUS
  Filled 2013-07-08 (×2): qty 250

## 2013-07-08 MED ORDER — SODIUM CHLORIDE 0.9 % IV SOLN
INTRAVENOUS | Status: DC
  Administered 2013-07-08: 13:00:00 via INTRAVENOUS

## 2013-07-08 MED ORDER — NIACIN ER (ANTIHYPERLIPIDEMIC) 1000 MG PO TBCR: 1000.0000 mg | EXTENDED_RELEASE_TABLET | Freq: Every day | ORAL | Status: DC

## 2013-07-08 MED ORDER — HEPARIN BOLUS VIA INFUSION
4000.0000 [IU] | Freq: Once | INTRAVENOUS | Status: AC
  Administered 2013-07-08: 4000 [IU] via INTRAVENOUS
  Filled 2013-07-08: qty 4000

## 2013-07-08 MED ORDER — SODIUM CHLORIDE 0.9 % IJ SOLN
3.0000 mL | Freq: Two times a day (BID) | INTRAMUSCULAR | Status: DC
  Administered 2013-07-08: 3 mL via INTRAVENOUS

## 2013-07-08 MED ORDER — ASPIRIN 81 MG PO CHEW
324.0000 mg | CHEWABLE_TABLET | Freq: Once | ORAL | Status: AC
  Administered 2013-07-08: 324 mg via ORAL

## 2013-07-08 MED ORDER — ZOLPIDEM TARTRATE 5 MG PO TABS: 5.0000 mg | ORAL_TABLET | Freq: Every evening | ORAL | Status: DC | PRN

## 2013-07-08 MED ORDER — HEPARIN (PORCINE) IN NACL 2-0.9 UNIT/ML-% IJ SOLN
INTRAMUSCULAR | Status: AC
  Filled 2013-07-08: qty 1000

## 2013-07-08 MED ORDER — METOPROLOL TARTRATE 25 MG PO TABS
25.0000 mg | ORAL_TABLET | Freq: Two times a day (BID) | ORAL | Status: DC
  Administered 2013-07-08 (×2): 25 mg via ORAL
  Filled 2013-07-08 (×4): qty 1

## 2013-07-08 MED ORDER — LIDOCAINE HCL (PF) 1 % IJ SOLN
INTRAMUSCULAR | Status: AC
  Filled 2013-07-08: qty 30

## 2013-07-08 MED ORDER — FENTANYL CITRATE 0.05 MG/ML IJ SOLN
INTRAMUSCULAR | Status: AC
  Filled 2013-07-08: qty 2

## 2013-07-08 MED ORDER — NIACIN ER 500 MG PO CPCR
1000.0000 mg | ORAL_CAPSULE | Freq: Every day | ORAL | Status: DC
  Administered 2013-07-08: 1000 mg via ORAL
  Filled 2013-07-08 (×2): qty 2

## 2013-07-08 MED ORDER — INFLUENZA VAC SPLIT QUAD 0.5 ML IM SUSP
0.5000 mL | INTRAMUSCULAR | Status: AC
  Administered 2013-07-09: 0.5 mL via INTRAMUSCULAR
  Filled 2013-07-08 (×2): qty 0.5

## 2013-07-08 MED ORDER — SODIUM CHLORIDE 0.9 % IJ SOLN: 3.0000 mL | Freq: Two times a day (BID) | INTRAMUSCULAR | Status: DC

## 2013-07-08 MED ORDER — MIDAZOLAM HCL 2 MG/2ML IJ SOLN
INTRAMUSCULAR | Status: AC
  Filled 2013-07-08: qty 2

## 2013-07-08 NOTE — H&P (View-Only) (Signed)
Pt presented with chest pressure.  Began last pm and radiated into lt shoulder and lt arm.  Did keep him awake some of the night.  He has never taken NTG and did not take last pm.  He called today for appt. Here he continues with mild chest pressure, 1 NTG sl given and pain improved from 6/10 to 2/10.  BP dropped to 100 systolic.  Pressure is better and have discussed with Dr. Berry will admit to tele for cardiac cath today.  Will begin IV Heparin and add NTG paste.   IV fluids.  Pt informed of our  Recommendations and he is agreeable.  Pt has been NPO except for some water with ASA in office.  He has had 1 NTG sl and 4 baby ASA.  Please see hospital H&P for further info. 

## 2013-07-08 NOTE — Interval H&P Note (Signed)
History and Physical Interval Note:  07/08/2013 1:29 PM  William Strickland  has presented today for surgery, with the diagnosis of Chest pain  The various methods of treatment have been discussed with the patient and family. After consideration of risks, benefits and other options for treatment, the patient has consented to  Procedure(s): LEFT HEART CATHETERIZATION WITH CORONARY ANGIOGRAM (N/A) as a surgical intervention .  The patient's history has been reviewed, patient examined, no change in status, stable for surgery.  I have reviewed the patient's chart and labs.  Questions were answered to the patient's satisfaction.    Cath Lab Visit (complete for each Cath Lab visit)  Clinical Evaluation Leading to the Procedure:   ACS: no  Non-ACS:    Anginal Classification: CCS III  Anti-ischemic medical therapy: Maximal Therapy (2 or more classes of medications)  Non-Invasive Test Results: No non-invasive testing performed  Prior CABG: Previous CABG  [name], MD, [facility] Attending Cardiologist [facility]  HILTY,Kenneth C

## 2013-07-08 NOTE — Consult Note (Signed)
ANTICOAGULATION CONSULT NOTE - Initial Consult  Pharmacy Consult for Heparin Indication: chest pain/ACS  No Known Allergies  Patient Measurements: Height: 6' (182.9 cm) Weight: 247 lb 5.7 oz (112.2 kg) IBW/kg (Calculated) : 77.6 Heparin Dosing Weight: 101.5kg  Vital Signs: Temp: 97.6 F (36.4 C) (12/12 1258) Temp src: Oral (12/12 1258) BP: 156/68 mmHg (12/12 1258) Pulse Rate: 71 (12/12 1258)  Labs: No results found for this basename: HGB, HCT, PLT, APTT, LABPROT, INR, HEPARINUNFRC, CREATININE, CKTOTAL, CKMB, TROPONINI,  in the last 72 hours  CrCl is unknown because no creatinine reading has been taken.   Medical History: Past Medical History  Diagnosis Date  . CAD (coronary artery disease)   . Hyperlipidemia   . History of nuclear stress test 05/31/2010; 2013    non-ischemic ; normal perfusion    Medications:  No anticoagulants pta  Assessment: 57yom with hx CAD s/p CABG in 2004 presents with CP responsive to SL NTG. He will begin IV heparin with plans for cath today. Admission labs have not yet been drawn but no reason to suggest that they would be abnormal - do not want to delay the start of heparin.  Goal of Therapy:  Heparin level 0.3-0.7 units/ml Monitor platelets by anticoagulation protocol: Yes   Plan:  1) Heparin bolus 4000 units x 1 2) Heparin drip at 1400 units/hr 3) 6 hour heparin level versus follow up after cath  Fredrik Rigger 07/08/2013,1:02 PM

## 2013-07-08 NOTE — CV Procedure (Signed)
CARDIAC CATHETERIZATION REPORT  William Strickland   [id number] February 28, 1956  Performing Cardiologist: [name] Primary Physician: Alice Reichert, MD Primary Cardiologist:  Dr. Allyson Sabal  Procedures Performed:  Left Heart Catheterization via 5 Fr left femoral artery access  Left Ventriculography, (RAO/LAO) 15 ml/sec for 30 ml total contrast  Native Coronary Angiography  Left Internal Mammary Graft Angiography  Saphenous Vein Graft x 1  Free Radial / Composite SVG Artery Graft Angiography  Indication(s): chest pain  Pre-Procedural Diagnosis(es):  1. Unstable angina (CCS III)  Post-Procedural Diagnosis(es): 1. Non-cardiac chest pain  Pre-Procedural Non-invasive testing: N/A  History: 57 y.o. male presented with a history of CAD status post coronary artery bypass grafting in 2004 with an LIMA graft to the LAD, free RIMA graft to an OM branch (which Y's into a vein to a large diagonal branch), and a sequential vein to the PDA/PL A. His other problems include hyperlipidemia. He had an excellent lipid profile May of this year. He had a nonischemic Myoview in November of last year. He gets occasional atypical chest pain, a sharp pain in his lt ant chest. 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 Started before bed and he was aware of it during the night. He came in to office this am, has not had anything to eat or drink. He has not had meds either. Similar discomfort prior to CABG. Strong family hx of premature CAD as well. No associated symptoms of nausea, or SOB. Had awareness of his heart beat, no fast just hard. EKG without acute changes. He was admitted directly for cardiac catheterization for CCS III angina.   Risks / Complications include, but not limited to: Death, MI, CVA/TIA, VF/VT (with defibrillation), Bradycardia (need for  temporary pacer placement), contrast induced nephropathy, bleeding / bruising / hematoma / pseudoaneurysm, vascular or coronary injury (with possible emergent CT or Vascular Surgery), adverse medication reactions, infection.    Consent: Risks of procedure as well as the alternatives and risks of each were explained to the (patient/caregiver).  Consent for procedure obtained.  Procedure: The patient was brought to the 2nd Floor [facility] in the fasting state and prepped and draped in the usual sterile fashion for (Right groin) access.   Time Out: Verified patient identification, verified procedure, site/side was marked, verified correct patient position, special equipment/implants available, radiation safety measures in place (including badges and shielding), medications/allergies/relevent history reviewed, required imaging and test results available.  Performed  Procedure: The right femoral head was identified using tactile and fluoroscopic technique.  The right groin was anesthetized with 1% subcutaneous Lidocaine.  The right Common Femoral Artery was accessed using the Modified Seldinger Technique with placement of (5 Fr) sheath using the Seldinger technique.  The sheath was aspirated and flushed.  A 5 Fr JL4 Catheter was advanced of over a Standard J wire into the ascending Aorta.  The catheter was used to engage the left coronary artery.  Multiple cineangiographic views of the left coronary artery system(s) were performed. A 5 Fr JR4 Catheter was advanced of over a Safety J wire into the ascending Aorta.  The catheter was used to engage the right coronary artery and the SVG / Free RIMA-composite graft.  Multiple cineangiographic views of the right coronary artery system(s) and the grafts were performed. The catheter was then exchanged over a wire for a  LIMA catheter which was used to engage the LIMA. Multiple views of the LIMA were obtained. This catheter was then exchanged  over the Standard J wire for an angled Pigtail catheter that was advanced across the Aortic Valve.  LV hemodynamics were measured and Left Ventriculography was performed.  LV hemodynamics were then re-sampled, and the catheter was pulled back across the Aortic Valve for measurement of "pull-back" gradient.  The catheter and the wire was removed completely out of the body. The patient was transferred to the holding area where the sheath was removed with manual pressure held for hemostasis.   Recovery: The patient was transported to the cath lab holding area in stable condition.   The patient  was stable before, during and following the procedure.   Patient did tolerate procedure well. There were not complications.  EBL: Minimal  Medications:  Premedication: none  Sedation:  2 mg IV Versed, 50 mcg IV Fentanyl  Contrast:  100 ml Omnipaque  Local Anesthesia: 5 cc 1% lidocaine   Hemodynamics:  Central Aortic Pressure / Mean Aortic Pressure: 97/64  LV Pressure / LV End diastolic Pressure:  5  Left Ventriculography:  EF:  55-60%  Wall Motion: normal  MR: 0  Coronary Angiographic Data:  Left Main:  Angiographically normal. Bifurcates in the usual fashion to the LAD and LCx.  Left Anterior Descending (LAD):  This appears to be occluded in the mid-vessel. Competitive flow is seen distally.  1st diagonal (D1):  Large diagonal is seen filling distally with competitive flow.    Circumflex (LCx):  The native AV groove LCx trifurcates into a small OM1, large OM2 and otherwise is smaller in taper.  1st obtuse marginal:  Small vessel, patent.         2nd obtuse marginal:  Occluded proximally - seen filling distally with competitive flow.   Right Coronary Artery: Near total occlusion in the mid-vessel - distal reconstitution is noted.   posterior descending artery: seen filling distally  posterior lateral branch:  Seen filling distall  Grafts  LIMA - LAD: Patent, good distal runoff.  TIMI III flow.  FREE RIMA / Composite SVG - OM1 and DIAG (respectively): Patent, TIMI III flow. Excellent runoff.  SVG -RPDA and RPL sequential: Patent, TIMI III flow. Excellent runoff.  Impression: 1.  Widely patent grafts. 2.  LVEF 55-60%, no WMA's. 3.  LVEDP = 5 mmHg  Plan: 1.  Unclear etiology of chest pain, but no signs that it was cardiac in etiology. 2.  Bedrest for 4 hours - keep overnight for observation and continue to cycle cardiac enzymes. Ok to stop heparin gtts. 3.  Probable d/c in the am tomorrow.  The case and results was discussed with the patient and family if available.  The case and results was not discussed with the patient's PCP. The case and results was discussed with the patient's Cardiologist.  Time Spent Directly with the Patient:  60 minutes  [name], MD, [facility] Attending Cardiologist [facility]  Deundre Thong C 07/08/2013, 5:12 PM

## 2013-07-08 NOTE — H&P (Signed)
William Strickland is an 57 y.o. male.    Primary Cardiologist:Dr. Allyson Sabal  ZOX:WRUEAVW,UJWJX G, MD  Chief Complaint: chest pressure HPI: 57 year old mildly overweight widowed Caucasian male father of 3 children, grandfather to 6 grandchildren who I last saw in the office/18/13. He has a history of CAD status post coronary artery bypass grafting in 2004 with an LIMA graft to the LAD, RIMA graft to an OM branch, a vein to diagonal branch, and a sequential vein to the PDA/PL A. His other problems include hyperlipidemia. He had an excellent lipid profile May of this year. He had a nonischemic Myoview in November of last year. He gets occasional atypical chest pain, a sharp pain in his lt ant chest.    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  Started before bed and he was aware of it during the night.  He came in to office this am, has not had anything to eat or drink.  He has not had meds either.  Similar discomfort  prior to CABG.  Strong family hx of premature CAD as well.  No associated symptoms of nausea, or SOB.  Had awareness of his heart beat, no fast just hard.    EKG without acute changes.   We gave 1 NTG and pressure improved from 5-6/10 to 2/10 and then down to 1/10.  We also gave Baby ASA -4 tabs.  Dr. Allyson Sabal saw as well.    Past Medical History  Diagnosis Date  . CAD (coronary artery disease)   . Hyperlipidemia   . History of nuclear stress test 05/31/2010; 2013    non-ischemic ; normal perfusion    Past Surgical History  Procedure Laterality Date  . Coronary artery bypass graft  2004    LIMA to LAD, LIMA to OM, vein to a diagonal branch, sequential vein to PDA and PLA    Family History  Problem Relation Age of Onset  . CAD    . Hyperlipidemia    . Heart attack Mother   . Heart disease Mother   . Hypertension  Mother   . Cancer Mother   . Heart disease Father   . Heart attack Brother 42  . CAD Brother    Social History:  reports that he has never smoked. He has never used smokeless tobacco. He reports that he does not drink alcohol or use illicit drugs. 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  No results found for this or any previous visit (from the past 48 hour(s)). No results found.  ROS: General:no colds or fevers, no weight changes Skin:no rashes or ulcers HEENT:no blurred vision, no congestion CV:see HPI PUL:see HPI GI:no diarrhea constipation or melena, no indigestion GU:no hematuria, no dysuria MS:no joint pain, no claudication Neuro:no syncope, no lightheadedness Endo:no diabetes, no thyroid disease   BP 100/80 P 73 Ht 6' Wt 253.6  BMI 34.36 PE: General:Pleasant affect, NAD Skin:Warm and dry, brisk capillary refill HEENT:normocephalic, sclera clear, mucus membranes moist Neck:supple, no JVD, no bruits  Heart:S1S2 RRR without murmur, gallup, rub or click Lungs:clear  without rales, rhonchi, or wheezes WUJ:WJXB, non tender, + BS, do not palpate liver spleen or masses Ext:no lower ext edema, 2+ pedal pulses, 2+ radial pulses Neuro:alert and oriented, MAE, follows commands, + facial symmetry    Assessment/Plan Principal Problem:   Unstable angina Active Problems:   Coronary artery disease, 2004 CABG   Hyperlipidemia   PLAN: Dr. Allyson Sabal has seen pt,  With chest pressure responding to NTG and similar to pressure prior to CABG.   Have given 4 baby ASA and admitting to tele.  Begin IV Heparin, NTG paste and check labs plan for cath today.    [facility] R Nurse Practitioner Certified [facility] Pager [phone] or after  5pm or weekends call [phone] 07/08/2013, 12:00 PM    Agree with note written by Nada Boozer RNP  Patient well-known to me with history of remote coronary bypass grafting. He's had chest pain since yesterday that lasted all night. It was relieved with Sub-lingual nitroglycerin  to some extent. His EKG showed no acute changes. His exam was benign. Plan will be to admit him to [facility]    hospital for cardiac catheterization. [name] 07/08/2013 2:05 PM

## 2013-07-08 NOTE — Progress Notes (Signed)
Utilization Review Completed.William Strickland T12/06/2013  

## 2013-07-08 NOTE — Progress Notes (Signed)
Pt presented with chest pressure.  Began last pm and radiated into lt shoulder and lt arm.  Did keep him awake some of the night.  He has never taken NTG and did not take last pm.  He called today for appt. Here he continues with mild chest pressure, 1 NTG sl given and pain improved from 6/10 to 2/10.  BP dropped to 100 systolic.  Pressure is better and have discussed with Dr. Allyson Sabal will admit to tele for cardiac cath today.  Will begin IV Heparin and add NTG paste.   IV fluids.  Pt informed of our  Recommendations and he is agreeable.  Pt has been NPO except for some water with ASA in office.  He has had 1 NTG sl and 4 baby ASA.  Please see hospital H&P for further info.

## 2013-07-08 NOTE — Patient Instructions (Signed)
Please go to [facility]. Admitting Department. [place] on [address] & take a right onto [address] - the Admitting Department is on the right.  Ride in a wheelchair into the hospital.  You have a bed on [address], Room [address]  You will have a heart cath today.

## 2013-07-08 NOTE — Assessment & Plan Note (Signed)
CABG 2004 LIMA-LAD; FREE RIMA-OM; VG-DIAG OF LAD; SEQ VG-PDA & PLA OF RCA

## 2013-07-09 DIAGNOSIS — R079 Chest pain, unspecified: Secondary | ICD-10-CM

## 2013-07-09 DIAGNOSIS — E785 Hyperlipidemia, unspecified: Secondary | ICD-10-CM | POA: Diagnosis not present

## 2013-07-09 DIAGNOSIS — R0789 Other chest pain: Secondary | ICD-10-CM | POA: Diagnosis not present

## 2013-07-09 LAB — LIPID PANEL
Cholesterol: 121 mg/dL (ref 0–200)
HDL: 31 mg/dL — ABNORMAL LOW (ref >39)
LDL Cholesterol: 71 mg/dL (ref 0–99)
Triglycerides: 94 mg/dL (ref <150)

## 2013-07-09 NOTE — Discharge Summary (Signed)
Physician Discharge Summary       Patient ID: William Strickland MRN: [id number] DOB/AGE: [birth year] 57 y.o.  Admit date: 07/08/2013 Discharge date: 07/09/2013  Discharge Diagnoses:  Principal Problem:   Chest pain, not due to CAD, grafts patent on cardiac cath, may be GI, if re occurs will try zantac Active Problems:   Coronary artery disease, 2004 CABG, cath 07/08/13 with patent grafts   Hyperlipidemia   Unstable angina   Discharged Condition: good  Procedures: 07/08/13 cardiac cath by Dr. Rennis Golden.  Patent grafts  Hospital Course:  57 year old mildly overweight widowed Caucasian male father of 3 children, grandfather to 6 grandchildren with a history of CAD status post coronary artery bypass grafting in 2004 with an LIMA graft to the LAD, RIMA graft to an OM branch, a vein to diagonal branch, and a sequential vein to the PDA/PL A. His other problems include hyperlipidemia. He had an excellent lipid profile May of this year. He had a nonischemic Myoview in November of last year. He gets occasional atypical chest pain, a sharp pain in his lt ant chest.   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 Started before bed and he was aware of it during the night. He came in to office this am, has not had anything to eat or drink. He has not had meds either. Similar discomfort prior to CABG. Strong family hx of premature CAD as well. No associated symptoms of nausea, or SOB. Had awareness of his heart beat, no fast just hard. EKG without acute changes.  Dr. Allyson Sabal and I felt eval with cath was warrented as grafts are 58 years old and these symptoms similar to pre CABG pain.   Pt was admitted and underwent cardiac cath finding patent grafts.  By today pt walked without complaints and his exam was benign.  He was found stable for discharge by Dr. Rennis Golden.   He will follow up as outpt.  His hgb A1c was mildly elevated and we gave instructions on diet.   Consults: None  Significant Diagnostic Studies:  BMET    Component Value Date/Time   NA 139 07/08/2013 1400   K 4.3 07/08/2013 1400   CL 102 07/08/2013 1400   CO2 27 07/08/2013 1400   GLUCOSE 81 07/08/2013 1400   BUN 12 07/08/2013 1400   CREATININE 0.92 07/08/2013 1400   CALCIUM 9.2 07/08/2013 1400   GFRNONAA >90 07/08/2013 1400   GFRAA >90 07/08/2013 1400    CBC    Component Value Date/Time   WBC 6.2 07/08/2013 1400   RBC 5.11 07/08/2013 1400   HGB 14.0 07/08/2013 1400   HCT 40.8 07/08/2013 1400   PLT 245 07/08/2013 1400   MCV 79.8 07/08/2013 1400   MCH 27.4 07/08/2013 1400   MCHC 34.3 07/08/2013 1400   RDW 12.7 07/08/2013 1400    Negative troponin Tot chol 121, TG 94, HDL 31, LDL 71,    TSH 1.195  Discharge Exam: Blood pressure 108/37, pulse 82, temperature 97.6 F (36.4 C), temperature source Oral, resp. rate 18, height 6' (1.829 m), weight 245 lb 9.5 oz (111.4 kg), SpO2 94.00%.   Disposition: 01-Home or Self Care     Medication List         aspirin 81 MG tablet  Take 81 mg by mouth daily.     atorvastatin 80 MG tablet  Commonly known  as:  LIPITOR  Take 1 tablet (80 mg total) by mouth daily.     metoprolol 50 MG tablet  Commonly known as:  LOPRESSOR  Take 25 mg by mouth 2 (two) times daily.     niacin 1000 MG CR tablet  Commonly known as:  NIASPAN  Take 1 tablet (1,000 mg total) by mouth at bedtime.     nitroGLYCERIN 0.4 MG SL tablet  Commonly known as:  NITROSTAT  Place 0.4 mg under the tongue every 5 (five) minutes as needed for chest pain.     VIAGRA 100 MG tablet  Generic drug:  sildenafil  Take 100 mg by mouth as needed.       Follow-up Information   Follow up with [facility] R, NP. (the office will call with date and time.)    Specialty:  Cardiology   Contact information:   [address] [place]  [address] [phone]        Discharge Instructions: Call [facility] at [phone] if any bleeding, swelling or drainage at cath site.  May shower, no tub baths for 48 hours for groin sticks.   No lifting over 5 pounds with rt arm, no driving for 3 days.  Heart Healthy diet.  Decrease sweets, use more brown bread and brown rice.  Your long acting glucose was elevated very slightly, so decreasing sweets and white breads will help. Your primary physician should follow.  Your good cholesterol is low, so continue exercising. Signed: Leone Brand Nurse Practitioner-Certified [facility]: [facility] 07/09/2013, 10:38 AM  Time spent on discharge :30 minutes.

## 2013-07-09 NOTE — Progress Notes (Signed)
Pt. Seen and examined. Agree with the NP/PA-C note as written.  No events overnight. Slept well. Groin site ok today. No evidence for MI. Cath showed patent grafts. Suspect this may have been GERD. Will advise dietary changes and prn H2 blocker for symptoms. Follow-up with APP in the office in 1-2 weeks.  [name], MD, [facility] Attending Cardiologist [facility]

## 2013-07-09 NOTE — Progress Notes (Signed)
         Subjective: No complaints  Objective: Vital signs in last 24 hours: Temp:  [97.6 F (36.4 C)-98.7 F (37.1 C)] 97.6 F (36.4 C) (12/13 0718) Pulse Rate:  [71-82] 82 (12/13 0718) Resp:  [18-20] 18 (12/13 0718) BP: (100-156)/(37-90) 108/37 mmHg (12/13 0718) SpO2:  [94 %-97 %] 94 % (12/13 0718) Weight:  [245 lb 9.5 oz (111.4 kg)-253 lb 6.4 oz (114.941 kg)] 245 lb 9.5 oz (111.4 kg) (12/13 0349) Weight change:  Last BM Date: 07/08/13 Intake/Output from previous day: 12/12 0701 - 12/13 0700 In: 246.8 [I.V.:246.8] Out: -  Intake/Output this shift:    PE: General:Pleasant affect, NAD Skin:Warm and dry, brisk capillary refill HEENT:normocephalic, sclera clear, mucus membranes moist Heart:S1S2 RRR without murmur, gallup, rub or click Lungs:clear without rales, rhonchi, or wheezes VWU:JWJX, non tender, + BS, do not palpate liver spleen or masses Ext:no lower ext edema, 2+ pedal pulses, 2+ radial pulses Neuro:alert and oriented, MAE, follows commands, + facial symmetry  Lab Results:  Recent Labs  07/08/13 1400  WBC 6.2  HGB 14.0  HCT 40.8  PLT 245   BMET  Recent Labs  07/08/13 1400  NA 139  K 4.3  CL 102  CO2 27  GLUCOSE 81  BUN 12  CREATININE 0.92  CALCIUM 9.2    Recent Labs  07/08/13 1400  TROPONINI <0.30    No results found for this basename: CHOL, HDL, LDLCALC, LDLDIRECT, TRIG, CHOLHDL   Lab Results  Component Value Date   HGBA1C 6.1* 07/08/2013     Lab Results  Component Value Date   TSH 1.195 07/08/2013       Studies/Results: No results found.  Medications: I have reviewed the patient's current medications. Scheduled Meds: . aspirin  81 mg Oral Daily  . atorvastatin  80 mg Oral q1800  . influenza vac split quadrivalent PF  0.5 mL Intramuscular Tomorrow-1000  . metoprolol  25 mg Oral BID  . niacin  1,000 mg Oral QHS  . nitroGLYCERIN  1 inch Topical Q6H  . sodium chloride  3 mL Intravenous Q12H   Continuous Infusions:    PRN Meds:.sodium chloride, acetaminophen, ALPRAZolam, nitroGLYCERIN, ondansetron (ZOFRAN) IV, sodium chloride, zolpidem  Assessment/Plan: Principal Problem:   Unstable angina Active Problems:   Coronary artery disease, 2004 CABG   Hyperlipidemia   Chest pain  PLAN: H/A, ntg paste stopped.  Patent 58 year old grafts. Ambulate and discharge home.  Lipids pending.   LOS: 1 day   [facility] R  Nurse Practitioner Certified Pager [phone] or after 5pm and on weekends call [phone] 07/09/2013, 7:41 AM

## 2013-07-18 ENCOUNTER — Encounter: Payer: Self-pay | Admitting: Cardiology

## 2013-07-25 ENCOUNTER — Ambulatory Visit: Payer: BC Managed Care – PPO | Admitting: Cardiology

## 2013-08-24 ENCOUNTER — Encounter: Payer: Self-pay | Admitting: Cardiology

## 2013-08-24 ENCOUNTER — Ambulatory Visit (INDEPENDENT_AMBULATORY_CARE_PROVIDER_SITE_OTHER): Payer: BC Managed Care – PPO | Admitting: Cardiology

## 2013-08-24 VITALS — BP 120/80 | HR 64 | Ht 72.0 in | Wt 257.0 lb

## 2013-08-24 DIAGNOSIS — I2510 Atherosclerotic heart disease of native coronary artery without angina pectoris: Secondary | ICD-10-CM

## 2013-08-24 DIAGNOSIS — E785 Hyperlipidemia, unspecified: Secondary | ICD-10-CM

## 2013-08-24 NOTE — Progress Notes (Signed)
08/24/2013   PCP: Alice Reichert, MD   Chief Complaint  Patient presents with  . [facility]    [facility]    Primary Cardiologist: Dr. Allyson Sabal  HPI: 58 year old mildly overweight widowed Caucasian male father of 3 children, grandfather to 6 grandchildren with a history of CAD status post coronary artery bypass grafting in 2004 with an LIMA graft to the LAD, RIMA graft to an OM branch, a vein to diagonal branch, and a sequential vein to the PDA/PL A. His other problems include hyperlipidemia. He had an excellent lipid profile May of this year. He had a nonischemic Myoview in November of last year. He gets occasional atypical chest pain, a sharp pain in his lt ant chest.   He was seen 07/08/13 for chest pain, sounding like unstable angina.  He was admitted to [facility] and and underwent cath as grafts were 58 years old.  Grafts were patent and pt was discharged with complications.   It was though the pain could have been GI.  Today pt is back for follow up.  He has not had any more pain and feels great.  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  No complaints today.  His hgbA1c was elevated and he was given instructions to watch diet.  He will follow up with PCP.     No Known Allergies  Current Outpatient Prescriptions  Medication Sig Dispense Refill  . aspirin 81 MG tablet Take 81 mg by mouth daily.      Marland Kitchen atorvastatin (LIPITOR) 80 MG tablet Take 1 tablet (80 mg total) by mouth daily.  30 tablet  11  . metoprolol (LOPRESSOR) 50 MG tablet Take 25 mg by mouth 2 (two) times daily.      . niacin (NIASPAN) 1000 MG CR tablet Take 1 tablet (1,000 mg total) by mouth at bedtime.  90 tablet  1  . nitroGLYCERIN (NITROSTAT) 0.4 MG SL tablet Place 0.4 mg under the tongue every 5 (five) minutes as needed for chest pain.      Marland Kitchen VIAGRA 100 MG tablet Take 100 mg by mouth as needed.       No current facility-administered medications for this visit.    Past  Medical History  Diagnosis Date  . CAD (coronary artery disease)     hx CABG 2004, last cath 06/2013 with patent grafts  . Hyperlipidemia   . History of nuclear stress test 05/31/2010; 2013    non-ischemic ; normal perfusion    Past Surgical History  Procedure Laterality Date  . Coronary artery bypass graft  2004    LIMA to LAD, LIMA to OM, vein to a diagonal branch, sequential vein to PDA and PLA  . Cardiac catheterization  06/2013    Patent grafts    OZH:YQMVHQI:ON colds or fevers, no weight changes Skin:no rashes or ulcers HEENT:no blurred vision, no congestion CV:see HPI PUL:see HPI GI:no diarrhea constipation or melena, no indigestion GU:no hematuria, no dysuria MS:no joint pain, no claudication Neuro:no syncope, no lightheadedness Endo:no diabetes, no thyroid disease  PHYSICAL EXAM BP 120/80  Pulse 64  Ht 6' (1.829 m)  Wt 257 lb (116.574 kg)  BMI 34.85 kg/m2 General:Pleasant affect, NAD Skin:Warm and dry, brisk capillary refill HEENT:normocephalic, sclera clear, mucus membranes moist Heart:S1S2 RRR without murmur, gallup, rub or click Lungs:clear without rales, rhonchi, or wheezes GEX:BMWU, non tender, + BS, do not palpate liver spleen or masses Ext:no lower ext edema  Neuro:alert and oriented, MAE, follows commands, + facial symmetry   ASSESSMENT AND PLAN Coronary artery disease, 2004 CABG, cath 07/08/13 with patent grafts Stable without complaints.  Will follow up with Dr. Allyson SabalBerry in 6 months.  No complications from cardiac cath.  Hyperlipidemia Last checked 12/14 HDL is low.

## 2013-08-24 NOTE — Patient Instructions (Signed)
Eat heart healthy diet.  Keep active. Call if any problems.  Follow up with Dr. Allyson SabalBerry in 6 months.

## 2013-08-24 NOTE — Assessment & Plan Note (Signed)
Stable without complaints.  Will follow up with Dr. Allyson SabalBerry in 6 months.  No complications from cardiac cath.

## 2013-08-24 NOTE — Assessment & Plan Note (Signed)
Last checked 12/14 HDL is low.

## 2013-10-06 ENCOUNTER — Other Ambulatory Visit: Payer: Self-pay | Admitting: *Deleted

## 2013-10-06 MED ORDER — NIACIN ER (ANTIHYPERLIPIDEMIC) 1000 MG PO TBCR: 1000.0000 mg | EXTENDED_RELEASE_TABLET | Freq: Every day | ORAL | Status: DC

## 2014-01-13 ENCOUNTER — Other Ambulatory Visit: Payer: Self-pay | Admitting: Cardiovascular Disease

## 2014-01-16 NOTE — Telephone Encounter (Signed)
Rx was sent to pharmacy electronically. 

## 2014-04-03 ENCOUNTER — Other Ambulatory Visit: Payer: Self-pay | Admitting: Cardiovascular Disease

## 2014-04-04 NOTE — Telephone Encounter (Signed)
Rx was sent to pharmacy electronically. 

## 2014-04-13 ENCOUNTER — Other Ambulatory Visit: Payer: Self-pay | Admitting: Cardiovascular Disease

## 2014-04-13 NOTE — Telephone Encounter (Signed)
Rx was sent to pharmacy electronically. 

## 2014-06-05 ENCOUNTER — Other Ambulatory Visit: Payer: Self-pay | Admitting: Cardiovascular Disease

## 2014-06-05 NOTE — Telephone Encounter (Signed)
E sent to pharmacy 

## 2014-06-27 ENCOUNTER — Ambulatory Visit (HOSPITAL_COMMUNITY)
Admission: RE | Admit: 2014-06-27 | Discharge: 2014-06-27 | Disposition: A | Discharge status: Home / Self Care | Payer: BC Managed Care – PPO | Source: Ambulatory Visit | Attending: Family Medicine | Admitting: Family Medicine

## 2014-06-27 ENCOUNTER — Other Ambulatory Visit (HOSPITAL_COMMUNITY): Payer: Self-pay | Admitting: Family Medicine

## 2014-06-27 DIAGNOSIS — W19XXXA Unspecified fall, initial encounter: Secondary | ICD-10-CM

## 2014-06-27 DIAGNOSIS — M25512 Pain in left shoulder: Secondary | ICD-10-CM | POA: Insufficient documentation

## 2014-07-06 ENCOUNTER — Encounter (HOSPITAL_COMMUNITY): Payer: Self-pay | Admitting: Internal Medicine

## 2014-07-06 ENCOUNTER — Telehealth: Payer: Self-pay | Admitting: *Deleted

## 2014-07-06 NOTE — Telephone Encounter (Signed)
Dr. Allyson SabalBerry this pt needs medical clearance to have left shoulder surgery. It will be scheduled pending clearance. Dr. Francena HanlyKevin Supple is the surgeon requesting clearance.

## 2014-07-06 NOTE — Telephone Encounter (Signed)
William Strickland can you please schedule an appointment, at next available.

## 2014-07-06 NOTE — Telephone Encounter (Signed)
The last time I saw him in the office was 02/02/13. I can't clear him unless he's seen back in my office first  JJB

## 2014-07-10 ENCOUNTER — Telehealth: Payer: Self-pay | Admitting: Cardiovascular Disease

## 2014-07-10 NOTE — Telephone Encounter (Signed)
Pt has appt with brittainy on Thursday for surgical clearance but surgeon wants to do surgery on his shoulder on Thursday.  Says he had "everything" done a year ago.  Anyway to sign him off or work him in?

## 2014-07-10 NOTE — Telephone Encounter (Signed)
Patient is wanting to get shoulder surgery done ASAP (12/17 if possible)  Can you please call him should there be a cancellation tomorrow or Wednesday with Dr. Allyson SabalBerry or extender?  Thanks!

## 2014-07-10 NOTE — Telephone Encounter (Signed)
LMTCB  See tele encounter from 12/10

## 2014-07-11 ENCOUNTER — Ambulatory Visit (INDEPENDENT_AMBULATORY_CARE_PROVIDER_SITE_OTHER): Payer: BC Managed Care – PPO | Admitting: Cardiovascular Disease

## 2014-07-11 ENCOUNTER — Encounter: Payer: Self-pay | Admitting: Cardiovascular Disease

## 2014-07-11 VITALS — BP 112/70 | HR 65 | Ht 72.0 in | Wt 235.2 lb

## 2014-07-11 DIAGNOSIS — I2583 Coronary atherosclerosis due to lipid rich plaque: Secondary | ICD-10-CM

## 2014-07-11 DIAGNOSIS — Z79899 Other long term (current) drug therapy: Secondary | ICD-10-CM

## 2014-07-11 DIAGNOSIS — I2510 Atherosclerotic heart disease of native coronary artery without angina pectoris: Secondary | ICD-10-CM

## 2014-07-11 DIAGNOSIS — E785 Hyperlipidemia, unspecified: Secondary | ICD-10-CM

## 2014-07-11 LAB — HEPATIC FUNCTION PANEL
ALBUMIN: 4.6 g/dL (ref 3.5–5.2)
ALK PHOS: 86 U/L (ref 39–117)
ALT: 21 U/L (ref 0–53)
AST: 20 U/L (ref 0–37)
BILIRUBIN DIRECT: 0.1 mg/dL (ref 0.0–0.3)
BILIRUBIN INDIRECT: 0.4 mg/dL (ref 0.2–1.2)
BILIRUBIN TOTAL: 0.5 mg/dL (ref 0.2–1.2)
Total Protein: 7.2 g/dL (ref 6.0–8.3)

## 2014-07-11 LAB — LIPID PANEL
Cholesterol: 105 mg/dL (ref 0–200)
HDL: 36 mg/dL — AB (ref >39)
LDL CALC: 50 mg/dL (ref 0–99)
Total CHOL/HDL Ratio: 2.9 Ratio
Triglycerides: 93 mg/dL (ref <150)
VLDL: 19 mg/dL (ref 0–40)

## 2014-07-11 NOTE — Telephone Encounter (Signed)
Pt seen in office today and cleared at low risk for surgery (refer to office note). [facility], signed and dated by Dr. Allyson SabalBerry. Then faxed. Original saved to be scanned into chart.

## 2014-07-11 NOTE — Patient Instructions (Signed)
Dr. Allyson SabalBerry has ordered for you to have lab work done today.  You have been cleared for surgery at low risk, we will fax form.  Your physician wants you to follow-up in 1 year with Dr. Allyson SabalBerry. You will receive a reminder letter in the mail 2 months in advance. If you do not receive a letter, please call our office to schedule the follow-up appointment.

## 2014-07-11 NOTE — Assessment & Plan Note (Signed)
History of hyperlipidemia on atorvastatin 80 mg a day. His last lipid profile was performed one year ago 07/09/13 revealed a total cholesterol of 121, LDL 71 and HDL of 31. We will recheck a lipid liver profile today. Continue current meds at current dosing

## 2014-07-11 NOTE — Assessment & Plan Note (Signed)
History of CAD status post coronary artery bypass grafting in 2004 with a LIMA to his LAD, RIMA graft to an obtuse marginal branch, vein to a diagonal branch and sequential vein to the PDA/PLA arteries. His last Myoview stress test performed 06/04/12 was nonischemic. He denies chest pain or shortness of breath.

## 2014-07-11 NOTE — Progress Notes (Signed)
07/11/2014 Samara DeistJoseph M Edgington   1956-02-11  [id number]  Primary Physician Alice ReichertMCINNIS,ANGUS G, MD Primary Cardiologist: Runell GessJonathan J. Chasidy Janak MD Roseanne RenoFACP,FACC,FAHA, FSCAI   HPI:  Mr. Nolen MuMcKinney is a 58 year old mildly overweight widowed Caucasian male father of 3 children, grandfather to 6 grandchildren who I last saw in the office 02/02/13.  He has a history of CAD status post coronary artery bypass grafting in 2004 with an LIMA graft to the LAD, RIMA graft to an OM branch, a vein to diagonal branch, and a sequential vein to the PDA/PL A. His other problems include hyperlipidemia. He had an excellent lipid profile May of this year. He had a nonischemic Myoview  06/04/12.Marland Kitchen. He denies chest pain or shortness of breath. He has lost 20 pounds since I saw him last with resulted by exercise. He scheduled for arthroscopic left shoulder surgery tomorrow. I'm going to clear him for this at low cardiovascular risk.   Current Outpatient Prescriptions  Medication Sig Dispense Refill  . aspirin 81 MG tablet Take 81 mg by mouth daily.    Marland Kitchen. atorvastatin (LIPITOR) 80 MG tablet take 1 tablet by mouth once daily 30 tablet 4  . diazepam (VALIUM) 5 MG tablet   0  . ibuprofen (ADVIL,MOTRIN) 800 MG tablet   0  . metoprolol (LOPRESSOR) 50 MG tablet take 1/2 tablet by mouth twice a day 90 tablet 2  . Niacin CR 1000 MG TBCR take 1 tablet by mouth at bedtime 90 tablet 1  . nitroGLYCERIN (NITROSTAT) 0.4 MG SL tablet Place 0.4 mg under the tongue every 5 (five) minutes as needed for chest pain.    Marland Kitchen. VIAGRA 100 MG tablet Take 100 mg by mouth as needed.    . [DISCONTINUED] niacin (NIASPAN) 1000 MG CR tablet Take 1 tablet (1,000 mg total) by mouth at bedtime. 90 tablet 1   No current facility-administered medications for this visit.    No Known Allergies  History   Social History  . Marital Status: Widowed    Spouse Name: N/A    Number of Children: 3  . Years of Education: N/A   Occupational History  . Not on file.    Social History Main Topics  . Smoking status: Never Smoker   . Smokeless tobacco: Never Used  . Alcohol Use: No  . Drug Use: No  . Sexual Activity:    Partners: Male   Other Topics Concern  . Not on file   Social History Narrative     Review of Systems: General: negative for chills, fever, night sweats or weight changes.  Cardiovascular: negative for chest pain, dyspnea on exertion, edema, orthopnea, palpitations, paroxysmal nocturnal dyspnea or shortness of breath Dermatological: negative for rash Respiratory: negative for cough or wheezing Urologic: negative for hematuria Abdominal: negative for nausea, vomiting, diarrhea, bright red blood per rectum, melena, or hematemesis Neurologic: negative for visual changes, syncope, or dizziness All other systems reviewed and are otherwise negative except as noted above.    Blood pressure 112/70, pulse 65, height 6' (1.829 m), weight 235 lb 3.2 oz (106.686 kg).  General appearance: alert and no distress Neck: no adenopathy, no carotid bruit, no JVD, supple, symmetrical, trachea midline and thyroid not enlarged, symmetric, no tenderness/mass/nodules Lungs: clear to auscultation bilaterally Heart: regular rate and rhythm, S1, S2 normal, no murmur, click, rub or gallop Extremities: extremities normal, atraumatic, no cyanosis or edema  EKG normal sinus rhythm at 65 without ST or T-wave changes. I personally reviewed this EKG  ASSESSMENT AND PLAN:   Coronary artery disease, 2004 CABG, cath 07/08/13 with patent grafts History of CAD status post coronary artery bypass grafting in 2004 with a LIMA to his LAD, RIMA graft to an obtuse marginal branch, vein to a diagonal branch and sequential vein to the PDA/PLA arteries. His last Myoview stress test performed 06/04/12 was nonischemic. He denies chest pain or shortness of breath.  Hyperlipidemia History of hyperlipidemia on atorvastatin 80 mg a day. His last lipid profile was performed one  year ago 07/09/13 revealed a total cholesterol of 121, LDL 71 and HDL of 31. We will recheck a lipid liver profile today. Continue current meds at current dosing      Runell GessJonathan J. Lamario Mani MD [facility] AssocFACP,FACC,FAHA, [facility] HospitalFSCAI 07/11/2014 12:12 PM

## 2014-07-13 ENCOUNTER — Ambulatory Visit: Payer: BC Managed Care – PPO | Admitting: Cardiology

## 2014-07-18 ENCOUNTER — Encounter (HOSPITAL_COMMUNITY)
Admission: RE | Admit: 2014-07-18 | Discharge: 2014-07-18 | Disposition: A | Discharge status: Home / Self Care | Payer: BC Managed Care – PPO | Source: Ambulatory Visit | Attending: Orthopedic Surgery | Admitting: Orthopedic Surgery

## 2014-07-18 ENCOUNTER — Encounter: Payer: Self-pay | Admitting: *Deleted

## 2014-07-18 ENCOUNTER — Encounter (HOSPITAL_COMMUNITY): Payer: Self-pay

## 2014-07-18 DIAGNOSIS — Z01812 Encounter for preprocedural laboratory examination: Secondary | ICD-10-CM | POA: Insufficient documentation

## 2014-07-18 LAB — CBC WITH DIFFERENTIAL/PLATELET
Basophils Absolute: 0 10*3/uL (ref 0.0–0.1)
Basophils Relative: 0 % (ref 0–1)
Eosinophils Absolute: 0.1 10*3/uL (ref 0.0–0.7)
Eosinophils Relative: 3 % (ref 0–5)
HEMATOCRIT: 43 % (ref 39.0–52.0)
HEMOGLOBIN: 14.2 g/dL (ref 13.0–17.0)
LYMPHS PCT: 26 % (ref 12–46)
Lymphs Abs: 1.3 10*3/uL (ref 0.7–4.0)
MCH: 27 pg (ref 26.0–34.0)
MCHC: 33 g/dL (ref 30.0–36.0)
MCV: 81.9 fL (ref 78.0–100.0)
MONOS PCT: 10 % (ref 3–12)
Monocytes Absolute: 0.5 10*3/uL (ref 0.1–1.0)
NEUTROS ABS: 2.9 10*3/uL (ref 1.7–7.7)
NEUTROS PCT: 61 % (ref 43–77)
Platelets: 299 10*3/uL (ref 150–400)
RBC: 5.25 MIL/uL (ref 4.22–5.81)
RDW: 12.7 % (ref 11.5–15.5)
WBC: 4.8 10*3/uL (ref 4.0–10.5)

## 2014-07-18 LAB — COMPREHENSIVE METABOLIC PANEL
ALK PHOS: 93 U/L (ref 39–117)
ALT: 24 U/L (ref 0–53)
AST: 21 U/L (ref 0–37)
Albumin: 4.2 g/dL (ref 3.5–5.2)
Anion gap: 7 (ref 5–15)
BUN: 11 mg/dL (ref 6–23)
CHLORIDE: 103 meq/L (ref 96–112)
CO2: 29 mmol/L (ref 19–32)
Calcium: 9.5 mg/dL (ref 8.4–10.5)
Creatinine, Ser: 0.94 mg/dL (ref 0.50–1.35)
GFR calc Af Amer: >90 mL/min (ref >90)
GFR, EST NON AFRICAN AMERICAN: 90 mL/min — AB (ref >90)
Glucose, Bld: 104 mg/dL — ABNORMAL HIGH (ref 70–99)
Potassium: 4.5 mmol/L (ref 3.5–5.1)
Sodium: 139 mmol/L (ref 135–145)
Total Bilirubin: 0.9 mg/dL (ref 0.3–1.2)
Total Protein: 7.3 g/dL (ref 6.0–8.3)

## 2014-07-18 LAB — PROTIME-INR
INR: 0.97 (ref 0.00–1.49)
Prothrombin Time: 13 seconds (ref 11.6–15.2)

## 2014-07-18 LAB — APTT: aPTT: 27 seconds (ref 24–37)

## 2014-07-18 NOTE — Progress Notes (Signed)
Primary - dr. Megan Mansmcginnis Sidney Ace(Lucky) Cardiologist - dr. Allyson SabalBerry - heart clearance in epic ekg in epic, stress 2013, 2014 had cath

## 2014-07-18 NOTE — Pre-Procedure Instructions (Signed)
William DeistJoseph M Strickland  07/18/2014   Your procedure is scheduled on:  Thursday, December 31st  [facility].  Call this number if you have problems the morning of surgery: [phone]   Remember:   Do not eat food or drink liquids after midnight.   Take these medicines the morning of surgery with A SIP OF WATER: lopressor   Do not wear jewelry.  Do not wear lotions, powders, or perfumes,deodorant.  Do not shave 48 hours prior to surgery. Men may shave face and neck.  Do not bring valuables to the hospital.  [facility] not [facility]  for any belongings or valuables.               Contacts, dentures or bridgework may not be worn into surgery.  Leave suitcase in the car. After surgery it may be brought to your room.  For patients admitted to the hospital, discharge time is determined by your treatment team.               Patients discharged the day of surgery will not be allowed to drive home.  Please read over the following fact sheets that you were given: Pain Booklet, Coughing and Deep Breathing and Surgical Site Infection Prevention  [facility] - Preparing for Surgery  Before surgery, you can play an important role.  Because skin is not sterile, your skin needs to be as free of germs as possible.  You can reduce the number of germs on you skin by washing with CHG (chlorahexidine gluconate) soap before surgery.  CHG is an antiseptic cleaner which kills germs and bonds with the skin to continue killing germs even after washing.  Please DO NOT use if you have an allergy to CHG or antibacterial soaps.  If your skin becomes reddened/irritated stop using the CHG and inform your nurse when you arrive at Short Stay.  Do not shave (including legs and underarms) for at least 48 hours prior to the first CHG shower.  You may shave your face.  Please follow these instructions carefully:   1.  Shower with CHG Soap the night before surgery and the  morning of Surgery.  2.  If you choose to wash your hair, wash your hair first as usual with your normal shampoo.  3.  After you shampoo, rinse your hair and body thoroughly to remove the shampoo.  4.  Use CHG as you would any other liquid soap.  You can apply CHG directly to the skin and wash gently with scrungie or a clean washcloth.  5.  Apply the CHG Soap to your body ONLY FROM THE NECK DOWN.  Do not use on open wounds or open sores.  Avoid contact with your eyes, ears, mouth and genitals (private parts).  Wash genitals (private parts) with your normal soap.  6.  Wash thoroughly, paying special attention to the area where your surgery will be performed.  7.  Thoroughly rinse your body with warm water from the neck down.  8.  DO NOT shower/wash with your normal soap after using and rinsing off the CHG Soap.  9.  Pat yourself dry with a clean towel.            10.  Wear clean pajamas.            11.  Place clean sheets on your bed the night of your first shower and do not sleep with pets.  Day of  Surgery  Do not apply any lotions/deoderants the morning of surgery.  Please wear clean clothes to the hospital/surgery center.

## 2014-07-26 MED ORDER — CHLORHEXIDINE GLUCONATE 4 % EX LIQD
60.0000 mL | Freq: Once | CUTANEOUS | Status: DC
  Filled 2014-07-26: qty 60

## 2014-07-26 MED ORDER — CEFAZOLIN SODIUM-DEXTROSE 2-3 GM-% IV SOLR
2.0000 g | INTRAVENOUS | Status: AC
  Administered 2014-07-27: 2 g via INTRAVENOUS
  Filled 2014-07-26: qty 50

## 2014-07-26 MED ORDER — LACTATED RINGERS IV SOLN
INTRAVENOUS | Status: DC
  Administered 2014-07-27: 13:00:00 via INTRAVENOUS

## 2014-07-27 ENCOUNTER — Encounter (HOSPITAL_COMMUNITY)
Admission: RE | Disposition: A | Discharge status: Home / Self Care | Payer: Self-pay | Source: Ambulatory Visit | Attending: Orthopedic Surgery

## 2014-07-27 ENCOUNTER — Ambulatory Visit (HOSPITAL_COMMUNITY): Payer: BC Managed Care – PPO | Admitting: Anesthesiology

## 2014-07-27 ENCOUNTER — Encounter (HOSPITAL_COMMUNITY): Payer: Self-pay | Admitting: *Deleted

## 2014-07-27 ENCOUNTER — Ambulatory Visit (HOSPITAL_COMMUNITY)
Admission: RE | Admit: 2014-07-27 | Discharge: 2014-07-27 | Disposition: A | Discharge status: Home / Self Care | Payer: BC Managed Care – PPO | Source: Ambulatory Visit | Attending: Orthopedic Surgery | Admitting: Orthopedic Surgery

## 2014-07-27 DIAGNOSIS — M129 Arthropathy, unspecified: Secondary | ICD-10-CM | POA: Insufficient documentation

## 2014-07-27 DIAGNOSIS — E785 Hyperlipidemia, unspecified: Secondary | ICD-10-CM | POA: Insufficient documentation

## 2014-07-27 DIAGNOSIS — Y929 Unspecified place or not applicable: Secondary | ICD-10-CM | POA: Diagnosis not present

## 2014-07-27 DIAGNOSIS — S46012A Strain of muscle(s) and tendon(s) of the rotator cuff of left shoulder, initial encounter: Secondary | ICD-10-CM | POA: Diagnosis not present

## 2014-07-27 DIAGNOSIS — Z79899 Other long term (current) drug therapy: Secondary | ICD-10-CM | POA: Insufficient documentation

## 2014-07-27 DIAGNOSIS — W19XXXA Unspecified fall, initial encounter: Secondary | ICD-10-CM | POA: Diagnosis not present

## 2014-07-27 DIAGNOSIS — I2510 Atherosclerotic heart disease of native coronary artery without angina pectoris: Secondary | ICD-10-CM | POA: Diagnosis not present

## 2014-07-27 DIAGNOSIS — Z7982 Long term (current) use of aspirin: Secondary | ICD-10-CM | POA: Insufficient documentation

## 2014-07-27 DIAGNOSIS — M7542 Impingement syndrome of left shoulder: Secondary | ICD-10-CM | POA: Insufficient documentation

## 2014-07-27 HISTORY — PX: SHOULDER ARTHROSCOPY WITH SUBACROMIAL DECOMPRESSION, ROTATOR CUFF REPAIR AND BICEP TENDON REPAIR: SHX5687

## 2014-07-27 SURGERY — SHOULDER ARTHROSCOPY WITH SUBACROMIAL DECOMPRESSION, ROTATOR CUFF REPAIR AND BICEP TENDON REPAIR
Anesthesia: Regional | Site: Shoulder | Laterality: Left

## 2014-07-27 MED ORDER — FENTANYL CITRATE 0.05 MG/ML IJ SOLN
100.0000 ug | Freq: Once | INTRAMUSCULAR | Status: AC
  Administered 2014-07-27: 100 ug via INTRAVENOUS

## 2014-07-27 MED ORDER — PROMETHAZINE HCL 25 MG/ML IJ SOLN
INTRAMUSCULAR | Status: AC
  Filled 2014-07-27: qty 1

## 2014-07-27 MED ORDER — MIDAZOLAM HCL 2 MG/2ML IJ SOLN
INTRAMUSCULAR | Status: AC
  Administered 2014-07-27: 2 mg via INTRAVENOUS
  Filled 2014-07-27: qty 2

## 2014-07-27 MED ORDER — PHENYLEPHRINE HCL 10 MG/ML IJ SOLN
INTRAMUSCULAR | Status: DC | PRN
  Administered 2014-07-27: 80 ug via INTRAVENOUS

## 2014-07-27 MED ORDER — GLYCOPYRROLATE 0.2 MG/ML IJ SOLN
INTRAMUSCULAR | Status: DC | PRN
  Administered 2014-07-27: 0.4 mg via INTRAVENOUS

## 2014-07-27 MED ORDER — CEFAZOLIN SODIUM-DEXTROSE 2-3 GM-% IV SOLR
INTRAVENOUS | Status: AC
  Filled 2014-07-27: qty 50

## 2014-07-27 MED ORDER — FENTANYL CITRATE 0.05 MG/ML IJ SOLN
INTRAMUSCULAR | Status: AC
  Administered 2014-07-27: 100 ug via INTRAVENOUS
  Filled 2014-07-27: qty 2

## 2014-07-27 MED ORDER — PHENYLEPHRINE 40 MCG/ML (10ML) SYRINGE FOR IV PUSH (FOR BLOOD PRESSURE SUPPORT)
PREFILLED_SYRINGE | INTRAVENOUS | Status: AC
  Filled 2014-07-27: qty 10

## 2014-07-27 MED ORDER — ONDANSETRON HCL 4 MG/2ML IJ SOLN
INTRAMUSCULAR | Status: DC | PRN
  Administered 2014-07-27: 4 mg via INTRAVENOUS

## 2014-07-27 MED ORDER — FENTANYL CITRATE 0.05 MG/ML IJ SOLN
INTRAMUSCULAR | Status: DC | PRN
  Administered 2014-07-27 (×2): 50 ug via INTRAVENOUS

## 2014-07-27 MED ORDER — HYDROMORPHONE HCL 1 MG/ML IJ SOLN: 0.2500 mg | INTRAMUSCULAR | Status: DC | PRN

## 2014-07-27 MED ORDER — BUPIVACAINE-EPINEPHRINE (PF) 0.5% -1:200000 IJ SOLN
INTRAMUSCULAR | Status: DC | PRN
  Administered 2014-07-27: 30 mL via PERINEURAL

## 2014-07-27 MED ORDER — NEOSTIGMINE METHYLSULFATE 10 MG/10ML IV SOLN
INTRAVENOUS | Status: DC | PRN
  Administered 2014-07-27: 3 mg via INTRAVENOUS

## 2014-07-27 MED ORDER — 0.9 % SODIUM CHLORIDE (POUR BTL) OPTIME
TOPICAL | Status: DC | PRN
  Administered 2014-07-27: 1000 mL

## 2014-07-27 MED ORDER — OXYCODONE-ACETAMINOPHEN 5-325 MG PO TABS
1.0000 | ORAL_TABLET | ORAL | Status: DC | PRN
Start: 2014-07-27 — End: 2015-07-11

## 2014-07-27 MED ORDER — ROCURONIUM BROMIDE 100 MG/10ML IV SOLN
INTRAVENOUS | Status: DC | PRN
  Administered 2014-07-27: 50 mg via INTRAVENOUS

## 2014-07-27 MED ORDER — PROMETHAZINE HCL 25 MG/ML IJ SOLN
6.2500 mg | INTRAMUSCULAR | Status: AC | PRN
  Administered 2014-07-27 (×2): 6.25 mg via INTRAVENOUS

## 2014-07-27 MED ORDER — LACTATED RINGERS IV SOLN
INTRAVENOUS | Status: DC | PRN
  Administered 2014-07-27 (×2): via INTRAVENOUS

## 2014-07-27 MED ORDER — LIDOCAINE HCL (CARDIAC) 20 MG/ML IV SOLN
INTRAVENOUS | Status: DC | PRN
  Administered 2014-07-27: 80 mg via INTRAVENOUS

## 2014-07-27 MED ORDER — PROPOFOL 10 MG/ML IV BOLUS
INTRAVENOUS | Status: DC | PRN
  Administered 2014-07-27: 200 mg via INTRAVENOUS

## 2014-07-27 MED ORDER — MIDAZOLAM HCL 2 MG/2ML IJ SOLN
2.0000 mg | Freq: Once | INTRAMUSCULAR | Status: AC
  Administered 2014-07-27: 2 mg via INTRAVENOUS

## 2014-07-27 MED ORDER — DIAZEPAM 5 MG PO TABS: 2.5000 mg | ORAL_TABLET | Freq: Four times a day (QID) | ORAL | Status: DC | PRN

## 2014-07-27 MED ORDER — SODIUM CHLORIDE 0.9 % IR SOLN
Status: DC | PRN
  Administered 2014-07-27 (×2): 3000 mL
  Administered 2014-07-27 (×2): 6000 mL
  Administered 2014-07-27 (×2): 3000 mL

## 2014-07-27 MED ORDER — FENTANYL CITRATE 0.05 MG/ML IJ SOLN
INTRAMUSCULAR | Status: AC
  Filled 2014-07-27: qty 5

## 2014-07-27 SURGICAL SUPPLY — 59 items
ANCHOR PEEK 4.75X19.1 SWLK C (Anchor) ×8 IMPLANT
BLADE CUTTER GATOR 3.5 (BLADE) ×2 IMPLANT
BLADE GREAT WHITE 4.2 (BLADE) ×2 IMPLANT
BLADE SURG 11 STRL SS (BLADE) ×2 IMPLANT
BOOTCOVER CLEANROOM LRG (PROTECTIVE WEAR) ×4 IMPLANT
BUR 3.5 LG SPHERICAL (BURR) IMPLANT
BUR OVAL 4.0 (BURR) ×2 IMPLANT
BURR 3.5 LG SPHERICAL (BURR)
CANISTER SUCT LVC 12 LTR MEDI- (MISCELLANEOUS) ×2 IMPLANT
CANNULA ACUFLEX KIT 5X76 (CANNULA) ×2 IMPLANT
CANNULA DRILOCK 5.0X75 (CANNULA) ×2 IMPLANT
CANNULA TWIST IN 8.25X7CM (CANNULA) ×2 IMPLANT
CONNECTOR 5 IN 1 STRAIGHT STRL (MISCELLANEOUS) ×2 IMPLANT
DRAPE INCISE 23X17 IOBAN STRL (DRAPES) ×1
DRAPE INCISE IOBAN 23X17 STRL (DRAPES) ×1 IMPLANT
DRAPE STERI 35X30 U-POUCH (DRAPES) ×2 IMPLANT
DRAPE SURG 17X11 SM STRL (DRAPES) ×2 IMPLANT
DRAPE U-SHAPE 47X51 STRL (DRAPES) ×2 IMPLANT
DRSG PAD ABDOMINAL 8X10 ST (GAUZE/BANDAGES/DRESSINGS) ×4 IMPLANT
DURAPREP 26ML APPLICATOR (WOUND CARE) ×4 IMPLANT
ELECT REM PT RETURN 9FT ADLT (ELECTROSURGICAL) ×2
ELECTRODE REM PT RTRN 9FT ADLT (ELECTROSURGICAL) ×1 IMPLANT
GAUZE SPONGE 4X4 12PLY STRL (GAUZE/BANDAGES/DRESSINGS) ×2 IMPLANT
GLOVE BIO SURGEON STRL SZ7.5 (GLOVE) ×2 IMPLANT
GLOVE BIO SURGEON STRL SZ8 (GLOVE) ×2 IMPLANT
GLOVE EUDERMIC 7 POWDERFREE (GLOVE) ×2 IMPLANT
GLOVE SS BIOGEL STRL SZ 7.5 (GLOVE) ×1 IMPLANT
GLOVE SUPERSENSE BIOGEL SZ 7.5 (GLOVE) ×1
GOWN STRL REUS W/ TWL LRG LVL3 (GOWN DISPOSABLE) ×1 IMPLANT
GOWN STRL REUS W/ TWL XL LVL3 (GOWN DISPOSABLE) ×3 IMPLANT
GOWN STRL REUS W/TWL LRG LVL3 (GOWN DISPOSABLE) ×1
GOWN STRL REUS W/TWL XL LVL3 (GOWN DISPOSABLE) ×3
KIT BASIN OR (CUSTOM PROCEDURE TRAY) ×2 IMPLANT
KIT ROOM TURNOVER OR (KITS) ×2 IMPLANT
KIT SHOULDER TRACTION (DRAPES) ×2 IMPLANT
MANIFOLD NEPTUNE II (INSTRUMENTS) ×2 IMPLANT
NDL SUT 6 .5 CRC .975X.05 MAYO (NEEDLE) IMPLANT
NEEDLE MAYO TAPER (NEEDLE)
NEEDLE SCORPION MULTI FIRE (NEEDLE) ×4 IMPLANT
NEEDLE SPNL 18GX3.5 QUINCKE PK (NEEDLE) ×2 IMPLANT
NS IRRIG 1000ML POUR BTL (IV SOLUTION) ×2 IMPLANT
PACK SHOULDER (CUSTOM PROCEDURE TRAY) ×2 IMPLANT
PAD ARMBOARD 7.5X6 YLW CONV (MISCELLANEOUS) ×4 IMPLANT
SET ARTHROSCOPY TUBING (MISCELLANEOUS) ×1
SET ARTHROSCOPY TUBING LN (MISCELLANEOUS) ×1 IMPLANT
SLING ARM LRG ADULT FOAM STRAP (SOFTGOODS) IMPLANT
SLING ARM MED ADULT FOAM STRAP (SOFTGOODS) IMPLANT
SPONGE LAP 4X18 X RAY DECT (DISPOSABLE) ×2 IMPLANT
STRIP CLOSURE SKIN 1/2X4 (GAUZE/BANDAGES/DRESSINGS) ×2 IMPLANT
SUT MNCRL AB 3-0 PS2 18 (SUTURE) ×2 IMPLANT
SUT PDS AB 0 CT 36 (SUTURE) ×2 IMPLANT
SUT RETRIEVER GRASP 30 DEG (SUTURE) IMPLANT
SUT TIGER TAPE 7 IN WHITE (SUTURE) ×4 IMPLANT
SYR 20CC LL (SYRINGE) ×2 IMPLANT
TAPE PAPER 3X10 WHT MICROPORE (GAUZE/BANDAGES/DRESSINGS) ×2 IMPLANT
TOWEL OR 17X24 6PK STRL BLUE (TOWEL DISPOSABLE) ×2 IMPLANT
TOWEL OR 17X26 10 PK STRL BLUE (TOWEL DISPOSABLE) ×2 IMPLANT
WAND SUCTION MAX 4MM 90S (SURGICAL WAND) ×2 IMPLANT
WATER STERILE IRR 1000ML POUR (IV SOLUTION) ×2 IMPLANT

## 2014-07-27 NOTE — Progress Notes (Signed)
Second page placed to Dr. Rennis ChrisSupple.  Patient placed back on oxygen due to episodes of low saturations.

## 2014-07-27 NOTE — Anesthesia Preprocedure Evaluation (Signed)
Anesthesia Evaluation  Patient identified by MRN, date of birth, ID band  Reviewed: Allergy & Precautions, H&P , NPO status , Patient's Chart, lab work & pertinent test results  Airway Mallampati: I       Dental   Pulmonary          Cardiovascular + angina + CAD and + CABG Rhythm:Regular     Neuro/Psych negative neurological ROS     GI/Hepatic negative GI ROS, Neg liver ROS,   Endo/Other  Morbid obesity  Renal/GU negative Renal ROS     Musculoskeletal   Abdominal   Peds  Hematology   Anesthesia Other Findings   Reproductive/Obstetrics                             Anesthesia Physical Anesthesia Plan  ASA: III  Anesthesia Plan: Regional and General   Post-op Pain Management:    Induction: Intravenous  Airway Management Planned: Oral ETT  Additional Equipment:   Intra-op Plan:   Post-operative Plan: Extubation in OR  Informed Consent: I have reviewed the patients History and Physical, chart, labs and discussed the procedure including the risks, benefits and alternatives for the proposed anesthesia with the patient or authorized representative who has indicated his/her understanding and acceptance.   Dental advisory given  Plan Discussed with: CRNA and Surgeon  Anesthesia Plan Comments:         Anesthesia Quick Evaluation

## 2014-07-27 NOTE — Discharge Instructions (Signed)
What to eat:  For your first meals, you should eat lightly; only small meals initially.  If you do not have nausea, you may eat larger meals.  Avoid spicy, greasy and heavy food.    General Anesthesia, Adult, Care After  Refer to this sheet in the next few weeks. These instructions provide you with information on caring for yourself after your procedure. Your health care provider may also give you more specific instructions. Your treatment has been planned according to current medical practices, but problems sometimes occur. Call your health care provider if you have any problems or questions after your procedure.  WHAT TO EXPECT AFTER THE PROCEDURE  After the procedure, it is typical to experience:  Sleepiness.  Nausea and vomiting. HOME CARE INSTRUCTIONS  For the first 24 hours after general anesthesia:  Have a responsible person with you.  Do not drive a car. If you are alone, do not take public transportation.  Do not drink alcohol.  Do not take medicine that has not been prescribed by your health care provider.  Do not sign important papers or make important decisions.  You may resume a normal diet and activities as directed by your health care provider.  Change bandages (dressings) as directed.  If you have questions or problems that seem related to general anesthesia, call the hospital and ask for the anesthetist or anesthesiologist on call. SEEK MEDICAL CARE IF:  You have nausea and vomiting that continue the day after anesthesia.  You develop a rash. SEEK IMMEDIATE MEDICAL CARE IF:  You have difficulty breathing.  You have chest pain.  You have any allergic problems. Document Released: 10/20/2000 Document Revised: 03/16/2013 Document Reviewed: 01/27/2013  [facility] Patient Information 2014 [place], [place].     William ReaKevin M. Supple, M.D., F.A.A.O.S. Orthopaedic Surgery Specializing in Arthroscopic and Reconstructive Surgery of the Shoulder and Knee [phone] [address] [place] [id number] - Fax [phone]  POST-OP SHOULDER ARTHROSCOPIC ROTATOR CUFF AND/OR LABRAL REPAIR INSTRUCTIONS  1. Call the office at [phone] to schedule your first post-op appointment 7-10 days from the date of your surgery.  2. Leave the steri-strips in place over your incisions when performing dressing changes and showering. You may remove your dressings and begin showering 72 hours from surgery. You can expect drainage that is clear to bloody in nature that occasionally will soak through your dressings. If this occurs go ahead and perform a dressing change. The drainage should lessen daily and when there is no drainage from your incisions feel free to go without a dressing.  3. Wear your sling/immobilizer at all times except to perform the exercises below or to occasionally let your arm dangle by your side to stretch your elbow. You also need to sleep in your sling immobilizer until instructed otherwise.  4. Range of motion to your elbow, wrist, and hand are encouraged 3-5 times daily. Exercise to your hand and fingers helps to reduce swelling you may experience.  5. Utilize ice to the shoulder 3-4 times minimum a day and additionally if you are experiencing pain.  6. You may one-armed drive when safely off of narcotics and muscle relaxants. You may use your hand that is in the sling to support the steering wheel only. However, should it be your right arm that is in the sling it is not to be used for gear shifting in a manual transmission.  7. If you had a block pre-operatively to provide post-op pain relief you may want  to go ahead and begin utilizing your pain meds as your arm begins to wake up. Blocks can sometimes last up to 16-18 hours. If you are still pain-free prior to going to bed you may want to strongly consider taking a pain medication to avoid being awakened in the night with the onset of pain. A muscle relaxant is also provided for you should you  experience muscle spasms. It is recommended that if you are experiencing pain that your pain medication alone is not controlling, add the muscle relaxant along with the pain medication which can give additional pain relief. The first one to two days is generally the most severe of your pain and then should gradually decrease. As your pain lessens it is recommended that you decrease your use of the pain medications to an "as needed basis" only and to always comply with the recommended dosages of the pain medications.  8. Pain medications can produce constipation along with their use. If you experience this, the use of an over the counter stool softener or laxative daily is recommended.   9. For additional questions or concerns, please do not hesitate to call the office. If after hours there is an answering service to forward your concerns to the physician on call.  POST-OP EXERCISES  Pendulum Exercises  Perform pendulum exercises while standing and bending at the waist. Support your uninvolved arm on a table or chair and allow your operated arm to hang freely. Make sure to do these exercises passively - not using you shoulder muscle.  Repeat 20 times. Do 3 sessions per day.

## 2014-07-27 NOTE — Anesthesia Procedure Notes (Addendum)
Anesthesia Regional Block:  Interscalene brachial plexus block  Pre-Anesthetic Checklist: ,, timeout performed, Correct Patient, Correct Site, Correct Laterality, Correct Procedure, Correct Position, site marked, Risks and benefits discussed,  Surgical consent,  Pre-op evaluation,  At surgeon's request and post-op pain management  Laterality: Upper and Left  Prep: chloraprep       Needles:  Injection technique: Single-shot  Needle Type: Echogenic Needle     Needle Length: 9cm 9 cm Needle Gauge: 21 and 21 G  Needle insertion depth: 4 cm   Additional Needles:  Procedures: ultrasound guided (picture in chart) and nerve stimulator Interscalene brachial plexus block  Nerve Stimulator or Paresthesia:  Response: Twitch elicited, 0.5 mA, 0.3 ms,   Additional Responses:   Narrative:  Start time: 07/27/2014 2:05 PM End time: 07/27/2014 2:25 PM Injection made incrementally with aspirations every 5 mL.  Performed by: Personally  Anesthesiologist: MASSAGEE, TERRY  Additional Notes: Block assessed prior to start of surgery. Tolerated well   Procedure Name: Intubation Date/Time: 07/27/2014 2:50 PM Performed by: Angelica PouSMITH, Cledis Sohn PIZZICARA Pre-anesthesia Checklist: Patient identified, Emergency Drugs available, Suction available, Patient being monitored and Timeout performed Patient Re-evaluated:Patient Re-evaluated prior to inductionOxygen Delivery Method: Circle system utilized Preoxygenation: Pre-oxygenation with 100% oxygen Intubation Type: IV induction Ventilation: Mask ventilation without difficulty Laryngoscope Size: Mac and 4 Grade View: Grade I Tube type: Oral Number of attempts: 1 Airway Equipment and Method: Stylet Placement Confirmation: ETT inserted through vocal cords under direct vision,  positive ETCO2 and breath sounds checked- equal and bilateral Secured at: 24 cm Tube secured with: Tape Dental Injury: Teeth and Oropharynx as per pre-operative assessment

## 2014-07-27 NOTE — H&P (Signed)
William DeistJoseph M Timmins    Chief Complaint: left shoulder rotator cuff tear HPI: The patient is a 58 y.o. male with a massive traumatic left shoulder rotator cuff tear  Past Medical History  Diagnosis Date  . CAD (coronary artery disease)     hx CABG 2004, last cath 06/2013 with patent grafts  . Hyperlipidemia   . History of nuclear stress test 05/31/2010; 2013    non-ischemic ; normal perfusion    Past Surgical History  Procedure Laterality Date  . Coronary artery bypass graft  2004    LIMA to LAD, LIMA to OM, vein to a diagonal branch, sequential vein to PDA and PLA  . Cardiac catheterization  06/2013    Patent grafts  . Left heart catheterization with coronary angiogram N/A 07/08/2013    Procedure: LEFT HEART CATHETERIZATION WITH CORONARY ANGIOGRAM;  Surgeon: Chrystie NoseKenneth C. Hilty, MD;  [facility];  Service: Cardiovascular;  Laterality: N/A;    Family History  Problem Relation Age of Onset  . CAD    . Hyperlipidemia    . Heart attack Mother   . Heart disease Mother   . Hypertension Mother   . Cancer Mother   . Heart disease Father   . Heart attack Brother 42  . CAD Brother     Social History:  reports that he has never smoked. He has never used smokeless tobacco. He reports that he does not drink alcohol or use illicit drugs.  Allergies: No Known Allergies  Medications Prior to Admission  Medication Sig Dispense Refill  . aspirin 81 MG tablet Take 81 mg by mouth daily.    Marland Kitchen. atorvastatin (LIPITOR) 80 MG tablet take 1 tablet by mouth once daily 30 tablet 4  . diazepam (VALIUM) 5 MG tablet Take 5 mg by mouth at bedtime as needed (to help rest).   0  . ibuprofen (ADVIL,MOTRIN) 800 MG tablet Take 800 mg by mouth every 8 (eight) hours as needed (for pain).   0  . metoprolol (LOPRESSOR) 50 MG tablet take 1/2 tablet by mouth twice a day 90 tablet 2  . Niacin CR 1000 MG TBCR take 1 tablet by mouth at bedtime 90 tablet 1  . nitroGLYCERIN (NITROSTAT) 0.4 MG SL tablet Place 0.4  mg under the tongue every 5 (five) minutes as needed for chest pain.    Marland Kitchen. VIAGRA 100 MG tablet Take 100 mg by mouth as needed for erectile dysfunction.        Physical Exam: left shoulder with profound weakness and loss of motion as noted at recent office visits  Vitals  Temp:  [98.1 F (36.7 C)] 98.1 F (36.7 C) (12/31 1251) Pulse Rate:  [71] 71 (12/31 1251) Resp:  [16] 16 (12/31 1251) BP: (118)/(92) 118/92 mmHg (12/31 1251) SpO2:  [98 %] 98 % (12/31 1251)  Assessment/Plan  Impression: left shoulder rotator cuff tear  Plan of Action: Procedure(s): LEFT SHOULDER ARTHROSCOPY WITH SUBACROMIAL DECOMPRESSION, DISTAL CLAVICLE RESECTION, ROTATOR CUFF REPAIR AND POSSIBLE BICEP TENODESIS  William Strickland M 07/27/2014, 2:05 PM

## 2014-07-27 NOTE — Progress Notes (Signed)
Report to S. Rennis PettyHerron RN as primary caregiver.

## 2014-07-27 NOTE — Anesthesia Postprocedure Evaluation (Signed)
  Anesthesia Post-op Note  Patient: Samara DeistJoseph M Lile  Procedure(s) Performed: Procedure(s): LEFT SHOULDER ARTHROSCOPY WITH SUBACROMIAL DECOMPRESSION, DISTAL CLAVICLE RESECTION, ROTATOR CUFF REPAIR AND POSSIBLE BICEP TENODESIS (Left)  Patient Location: PACU  Anesthesia Type:General and block  Level of Consciousness: awake and alert   Airway and Oxygen Therapy: Patient Spontanous Breathing  Post-op Pain: none  Post-op Assessment: Post-op Vital signs reviewed, Patient's Cardiovascular Status Stable and Respiratory Function Stable  Post-op Vital Signs: Reviewed  Filed Vitals:   07/27/14 1845  BP:   Pulse: 96  Temp:   Resp: 16    Complications: No apparent anesthesia complications

## 2014-07-27 NOTE — Transfer of Care (Signed)
Immediate Anesthesia Transfer of Care Note  Patient: William Strickland  Procedure(s) Performed: Procedure(s): LEFT SHOULDER ARTHROSCOPY WITH SUBACROMIAL DECOMPRESSION, DISTAL CLAVICLE RESECTION, ROTATOR CUFF REPAIR AND POSSIBLE BICEP TENODESIS (Left)  Patient Location: PACU  Anesthesia Type:General  Level of Consciousness: awake, oriented, patient cooperative and lethargic  Airway & Oxygen Therapy: Patient Spontanous Breathing and Patient connected to nasal cannula oxygen  Post-op Assessment: Report given to PACU RN and Post -op Vital signs reviewed and stable  Post vital signs: Reviewed and stable  Complications: No apparent anesthesia complications

## 2014-07-27 NOTE — Progress Notes (Signed)
Dr. Rennis ChrisSupple called by family request to ask for antiemetic to be called to pharmacy for patient.

## 2014-07-27 NOTE — Progress Notes (Signed)
PA WoodlawnBryan returned phone call.  [facility]

## 2014-07-27 NOTE — Op Note (Signed)
07/27/2014  4:37 PM  PATIENT:   William DeistJoseph M Bille  58 y.o. male  PRE-OPERATIVE DIAGNOSIS: Massive left shoulder rotator cuff tear, impingement, AC joint OA  POST-OPERATIVE DIAGNOSIS:  same  PROCEDURE:  LSA, SAD, DCR, RCR  SURGEON:  Eluterio Seymour, Vania ReaKevin M. M.D.  ASSISTANTS: Shuford pac   ANESTHESIA:   GET + ISB  EBL: min  SPECIMEN:  none  Drains: none   PATIENT DISPOSITION:  PACU - hemodynamically stable.    PLAN OF CARE: Discharge to home after PACU  Dictation# [id number]

## 2014-07-28 NOTE — Op Note (Signed)
NAMEMarland Kitchen  DOLPH, [name]:  [id number]  MEDICAL RECORD NO.:  [id number]  LOCATION:                                 FACILITY:  PHYSICIAN:  William Strickland, M.D.  DATE OF BIRTH:  Apr 22, 1956  DATE OF PROCEDURE:  07/27/2014 DATE OF DISCHARGE:  07/27/2014                              OPERATIVE REPORT   PREOPERATIVE DIAGNOSES: 1. Massive left shoulder rotator cuff tear. 2. Left shoulder impingement. 3. Left shoulder acromioclavicular joint arthropathy.  POSTOPERATIVE DIAGNOSES: 1. Massive left shoulder rotator cuff tear. 2. Shoulder impingement. 3. Left shoulder acromioclavicular joint arthropathy.  PROCEDURE: 1. Left shoulder examination under anesthesia. 2. Left shoulder diagnostic arthroscopy. 3. Arthroscopic subacromial decompression, bursectomy, and lysis of     adhesions. 4. Arthroscopic distal clavicle resection. 5. Arthroscopic repair of a massive rotator cuff tear involving the     entirety of the supra and infraspinatus.  SURGEON:  William Strickland, M.D.  ASSISTANT:  Ralene Bathe, PA-C.  ANESTHESIA:  General endotracheal as well as an interscalene block.  ESTIMATED BLOOD LOSS:  Minimal.  DRAINS:  None.  HISTORY:  William Strickland is a 59 year old gentleman who fell and injured his left shoulder with immediate complaints of severe pain, profound loss of motion, and profound weakness with subsequent MRI scan showing a massive tear of the rotator cuff tear.  He is brought to the operating room at this time for planned left shoulder arthroscopy with rotator cuff repair as described below.  Preoperatively, I counseled William Strickland regarding treatment options and risks versus benefits thereof.  Possible surgical complications were all reviewed including potential for bleeding, infection, neurovascular injury, persistent pain, loss of motion, anesthetic complication, recurrence of rotator cuff tear, possible need for additional surgery. He  understands and accepts and agrees with our planned procedure.  PROCEDURE IN DETAIL:  After undergoing routine preop evaluation, the patient received prophylactic antibiotics.  An interscalene block was established in the holding area by the Anesthesia Department.  Placed supine on the operating table, underwent smooth induction of LMA anesthesia.  Turned to the right lateral decubitus position on a beanbag and appropriately padded and protected.  Left shoulder examination under anesthesia revealed full motion and no obvious instability patterns. Left arm was then suspended at 70 degrees of abduction with 15 pounds of traction.  The left shoulder girdle region was sterilely prepped and draped in standard fashion.  Time-out was called.  Posterior portal was established in the glenohumeral joint.  Anterior portal was established under direct visualization.  The articular surfaces were in good condition.  Biceps tendon appeared stable both proximally and distally, normal caliber as well.  There was obvious large retracted tear of the supra and infraspinatus.  This was debrided to a limited extent from the articular side to healthy tissue.  Fluid and instrument were removed. The arm was dropped down to 30 degrees abduction with the arthroscope introduced in the subacromial space to the posterior portal and a direct lateral port was advanced in the subacromial space.  Abundant, dense bursal tissue and multiple adhesions were encountered.  These were all divided and excised with the shaver and Stryker wand.  The wand was then used  to remove the periosteum from the undersurface of the anterior half of the acromion and subacromial decompression was performed with a bur creating a type 1 morphology pedicles and established directly anterior to the distal clavicle and distal clavicle resection performed with a bur.  Care was taken to confirm visualization of the entire circumference of the distal  clavicle to ensure adequate removal of the bone.  We then completed the subacromial/subdeltoid bursectomy.  The rotator cuff tear was debrided from the bursal side.  The rotator cuff was mobilized using the shaver and Stryker wand at both articular and bursal sides and then confirmed the free margin could be easily brought back over the apex of the tuberosity covering the footprint completely. We did prepare the tuberosity removing soft tissue and gently abrading the bone and the footprint was approximately 5 cm in width.  Through a stab wound off the lateral margin of the acromion,placed 2 swivel lock loaded with an additional fiber tape and these 8 suture limbs were then passed through the equidistant across the width of the rotator cuff tear using the scorpion suture passer.  These were then passed through a tear of the swivel locks on the lateral margin of the tuberosity creating a double row construct and allowing the free margin of the rotator cuff to be nicely compressed against the footprint of the tuberosity and overall construct was much to our satisfaction.  Suture limbs were all clipped appropriately.  Final hemostasis was obtained.  Fluid and instrument were removed.  The port was closed with Monocryl and Steri-Strips.  A dry dressing was taped to the left shoulder.  Left arm was placed in a sling immobilizer, and the patient was awakened, extubated, and taken to the recovery room in stable condition.  Ralene Bathe, PA-C was used as an Geophysicist/field seismologist throughout this case, essential for help with positioning of the patient, positioning of the extremity, management of the arthroscopic equipment, tissue manipulation, suture management, wound closure, and intraoperative decision making.     William Strickland, M.D.     KMS/MEDQ  D:  07/27/2014  T:  07/28/2014  Job:  [id number]

## 2014-07-31 ENCOUNTER — Encounter (HOSPITAL_COMMUNITY): Payer: Self-pay | Admitting: Orthopedic Surgery

## 2014-08-07 ENCOUNTER — Ambulatory Visit (HOSPITAL_COMMUNITY): Payer: Self-pay | Attending: Orthopedic Surgery

## 2014-08-29 ENCOUNTER — Ambulatory Visit: Payer: BC Managed Care – PPO | Admitting: Cardiovascular Disease

## 2014-09-24 ENCOUNTER — Other Ambulatory Visit: Payer: Self-pay | Admitting: Cardiovascular Disease

## 2014-09-25 NOTE — Telephone Encounter (Signed)
Rx(s) sent to pharmacy electronically.  

## 2014-11-23 ENCOUNTER — Other Ambulatory Visit: Payer: Self-pay | Admitting: Cardiovascular Disease

## 2014-11-23 NOTE — Telephone Encounter (Signed)
Rx(s) sent to pharmacy electronically.  

## 2014-12-29 ENCOUNTER — Other Ambulatory Visit: Payer: Self-pay | Admitting: Cardiovascular Disease

## 2014-12-29 NOTE — Telephone Encounter (Signed)
Rx has been sent to the pharmacy electronically. ° °

## 2015-02-06 ENCOUNTER — Ambulatory Visit: Payer: Self-pay | Admitting: Orthopedic Surgery

## 2015-06-07 ENCOUNTER — Telehealth: Payer: Self-pay

## 2015-06-07 NOTE — Telephone Encounter (Signed)
Requesting surgical clearance:   1. Type of surgery: Right Knee: Scope  2. Surgeon: Dr. Durene RomansMatthew Olin  3. Surgical date: Pending Clearane  4. Medications that need to be held: none  5. CAD: yes     6. I will defer to: Candise BowensBerry   [facility]:  Sherry Willis [phone] phone [phone] fax

## 2015-06-08 NOTE — Telephone Encounter (Signed)
Cleared for arthroscopy

## 2015-06-13 NOTE — Telephone Encounter (Signed)
Pt said he was waiting to see if he was cleared for surgery.

## 2015-06-13 NOTE — Telephone Encounter (Signed)
Left message for pt that clearance paper work re-faxed to [facility].

## 2015-07-11 ENCOUNTER — Encounter: Payer: Self-pay | Admitting: Cardiovascular Disease

## 2015-07-11 ENCOUNTER — Ambulatory Visit (INDEPENDENT_AMBULATORY_CARE_PROVIDER_SITE_OTHER): Payer: BLUE CROSS/BLUE SHIELD | Admitting: Cardiovascular Disease

## 2015-07-11 VITALS — BP 116/78 | HR 67 | Ht 72.0 in | Wt 251.2 lb

## 2015-07-11 DIAGNOSIS — I2583 Coronary atherosclerosis due to lipid rich plaque: Secondary | ICD-10-CM

## 2015-07-11 DIAGNOSIS — E785 Hyperlipidemia, unspecified: Secondary | ICD-10-CM

## 2015-07-11 DIAGNOSIS — I2510 Atherosclerotic heart disease of native coronary artery without angina pectoris: Secondary | ICD-10-CM | POA: Diagnosis not present

## 2015-07-11 MED ORDER — NIACIN ER (ANTIHYPERLIPIDEMIC) 1000 MG PO TBCR: 1000.0000 mg | EXTENDED_RELEASE_TABLET | Freq: Every day | ORAL | Status: DC

## 2015-07-11 MED ORDER — NITROGLYCERIN 0.4 MG SL SUBL: 0.4000 mg | SUBLINGUAL_TABLET | SUBLINGUAL | Status: DC | PRN

## 2015-07-11 MED ORDER — METOPROLOL TARTRATE 50 MG PO TABS: 25.0000 mg | ORAL_TABLET | Freq: Two times a day (BID) | ORAL | Status: DC

## 2015-07-11 NOTE — Assessment & Plan Note (Signed)
History of coronary artery disease status post coronary artery bypass grafting in 2004. He'll LIMA to his LAD, RIMA to an OM branch, vein to diagonal branch and sequential vein to the PDA and PLA. His last Myoview performed 06/04/12 was nonischemic. He denies chest pain or shortness of breath.

## 2015-07-11 NOTE — Patient Instructions (Signed)

## 2015-07-11 NOTE — Progress Notes (Signed)
07/11/2015 William Strickland   April 27, 1956  [id number]  Primary Physician William Strickland,William Strickland, William Strickland Primary Cardiologist: Runell GessJonathan J. Berry William Strickland William Strickland,William Strickland,William Strickland, William Strickland   HPI:  Mr. William Strickland is a 59 year old mildly overweight widowed Caucasian male father of 3 children, grandfather to 6 grandchildren who I last saw in the office 07/11/14. He has a history of CAD status post coronary artery bypass grafting in 2004 with an LIMA graft to the LAD, RIMA graft to an OM branch, a vein to diagonal branch, and a sequential vein to the PDA/PL A. His other problems include hyperlipidemia. He had an excellent lipid profile May of this year. He had a nonischemic Myoview 06/04/12.Marland Kitchen. He denies chest pain or shortness of breath. He did well from a shoulder surgery after his last office visit and recently had arthroscopic knee procedure performed which was uncomplicated.  Current Outpatient Prescriptions  Medication Sig Dispense Refill  . aspirin EC 325 MG tablet Take 1 tablet by mouth daily.  0  . atorvastatin (LIPITOR) 80 MG tablet take 1 tablet by mouth once daily 30 tablet 9  . diazepam (VALIUM) 5 MG tablet Take 0.5-1 tablets (2.5-5 mg total) by mouth every 6 (six) hours as needed for muscle spasms or sedation. 40 tablet 1  . ibuprofen (ADVIL,MOTRIN) 800 MG tablet Take 800 mg by mouth every 8 (eight) hours as needed (for pain).   0  . meloxicam (MOBIC) 15 MG tablet Take 1 tablet by mouth 2 (two) times daily.  0  . metoprolol (LOPRESSOR) 50 MG tablet take 1/2 tablet by mouth twice a day 90 tablet 1  . niacin (NIASPAN) 1000 MG CR tablet Take 1 tablet (1,000 mg total) by mouth at bedtime. 90 tablet 2  . nitroGLYCERIN (NITROSTAT) 0.4 MG SL tablet Place 1 tablet (0.4 mg total) under the tongue every 5 (five) minutes as needed for chest pain. 25 tablet 1  . VIAGRA 100 MG tablet Take 100 mg by mouth as needed for erectile dysfunction.      No current facility-administered medications for this visit.    No Known  Allergies  Social History   Social History  . Marital Status: Widowed    Spouse Name: N/A  . Number of Children: 3  . Years of Education: N/A   Occupational History  . Not on file.   Social History Main Topics  . Smoking status: Never Smoker   . Smokeless tobacco: Never Used  . Alcohol Use: No  . Drug Use: No  . Sexual Activity:    Partners: Male   Other Topics Concern  . Not on file   Social History Narrative     Review of Systems: General: negative for chills, fever, night sweats or weight changes.  Cardiovascular: negative for chest pain, dyspnea on exertion, edema, orthopnea, palpitations, paroxysmal nocturnal dyspnea or shortness of breath Dermatological: negative for rash Respiratory: negative for cough or wheezing Urologic: negative for hematuria Abdominal: negative for nausea, vomiting, diarrhea, bright red blood per rectum, melena, or hematemesis Neurologic: negative for visual changes, syncope, or dizziness All other systems reviewed and are otherwise negative except as noted above.    Blood pressure 116/78, pulse 67, height 6' (1.829 m), weight 251 lb 3.2 oz (113.944 kg).  General appearance: alert and no distress Neck: no adenopathy, no carotid bruit, no JVD, supple, symmetrical, trachea midline and thyroid not enlarged, symmetric, no tenderness/mass/nodules Lungs: clear to auscultation bilaterally Heart: regular rate and rhythm, S1, S2 normal, no murmur, click, rub or  gallop Extremities: extremities normal, atraumatic, no cyanosis or edema  EKG normal sinus rhythm at 67 without ST or T-wave changes. I personally reviewed this EKG  ASSESSMENT AND PLAN:   Hyperlipidemia History of hyperlipidemia on statin therapy followed by his PCP  Coronary artery disease, 2004 CABG, cath 07/08/13 with patent grafts History of coronary artery disease status post coronary artery bypass grafting in 2004. He'll LIMA to his LAD, RIMA to an OM branch, vein to diagonal  branch and sequential vein to the PDA and PLA. His last Myoview performed 06/04/12 was nonischemic. He denies chest pain or shortness of breath.      [name] William Strickland FACP,William Strickland,William Strickland, [facility] 07/11/2015 2:56 PM

## 2015-07-11 NOTE — Assessment & Plan Note (Signed)
History of hyperlipidemia on statin therapy followed by his PCP 

## 2015-10-19 ENCOUNTER — Other Ambulatory Visit: Payer: Self-pay | Admitting: Cardiovascular Disease

## 2015-10-19 MED ORDER — ATORVASTATIN CALCIUM 80 MG PO TABS: 80.0000 mg | ORAL_TABLET | Freq: Every day | ORAL | Status: DC

## 2016-02-27 ENCOUNTER — Ambulatory Visit (INDEPENDENT_AMBULATORY_CARE_PROVIDER_SITE_OTHER): Payer: BLUE CROSS/BLUE SHIELD | Admitting: Family Medicine

## 2016-02-27 ENCOUNTER — Encounter: Payer: Self-pay | Admitting: Family Medicine

## 2016-02-27 VITALS — BP 128/82 | Ht 70.75 in | Wt 248.0 lb

## 2016-02-27 DIAGNOSIS — E785 Hyperlipidemia, unspecified: Secondary | ICD-10-CM | POA: Diagnosis not present

## 2016-02-27 DIAGNOSIS — Z125 Encounter for screening for malignant neoplasm of prostate: Secondary | ICD-10-CM

## 2016-02-27 DIAGNOSIS — Z79899 Other long term (current) drug therapy: Secondary | ICD-10-CM

## 2016-02-27 DIAGNOSIS — Z0000 Encounter for general adult medical examination without abnormal findings: Secondary | ICD-10-CM | POA: Diagnosis not present

## 2016-02-27 NOTE — Progress Notes (Signed)
   Subjective:    Patient ID: William Strickland, male    DOB: January 25, 1956, 60 y.o.   MRN: [id number]  HPI The patient comes in today for a wellness visit.  A review of their health history was completed.  A review of medications was also completed.  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 Allyson Sabal - cardiology  Preventative health issues were discussed.   Additional concerns: none  Neg colon canc er  colom  dtr rourek   Some fam hx of diabetes  ppos hx of prostate cancer    No new med ical problem Shingles vaccine    Review of Systems  Constitutional: Negative for activity change, appetite change and fever.  HENT: Negative for congestion and rhinorrhea.   Eyes: Negative for discharge.  Respiratory: Negative for cough and wheezing.   Cardiovascular: Negative for chest pain.  Gastrointestinal: Negative for abdominal pain, blood in stool and vomiting.  Genitourinary: Negative for difficulty urinating and frequency.  Musculoskeletal: Negative for neck pain.  Skin: Negative for rash.  Allergic/Immunologic: Negative for environmental allergies and food allergies.  Neurological: Negative for weakness and headaches.  Psychiatric/Behavioral: Negative for agitation.  All other systems reviewed and are negative.      Objective:   Physical Exam  Constitutional: He appears well-developed and well-nourished.  HENT:  Head: Normocephalic and atraumatic.  Right Ear: External ear normal.  Left Ear: External ear normal.  Nose: Nose normal.  Mouth/Throat: Oropharynx is clear and moist.  Eyes: EOM are normal. Pupils are equal, round, and reactive to light.  Neck: Normal range of motion. Neck supple. No thyromegaly present.  Cardiovascular: Normal rate, regular rhythm and normal heart sounds.   No murmur  heard. Pulmonary/Chest: Effort normal and breath sounds normal. No respiratory distress. He has no wheezes.  Abdominal: Soft. Bowel sounds are normal. He exhibits no distension and no mass. There is no tenderness.  Genitourinary: Penis normal.  Musculoskeletal: Normal range of motion. He exhibits no edema.  Lymphadenopathy:    He has no cervical adenopathy.  Neurological: He is alert. He exhibits normal muscle tone.  Skin: Skin is warm and dry. No erythema.  Psychiatric: He has a normal mood and affect. His behavior is normal. Judgment normal.  Vitals reviewed.         Assessment & Plan:  Impression 1 wellness exam. #2 coronary artery disease clinically stable status post CABG #3 hyperlipidemia under medication treatment #4 chronic left shoulder and right knee pain. Status post surgery shoulder help significantly knee plus minus plan time for colonoscopy given sheet encouraged to call Dr. Genia Del office. Appropriate blood work. Recheck in 6 months. WSL

## 2016-03-08 LAB — BASIC METABOLIC PANEL
BUN/Creatinine Ratio: 13 (ref 10–24)
BUN: 13 mg/dL (ref 8–27)
CO2: 23 mmol/L (ref 18–29)
Calcium: 9.2 mg/dL (ref 8.6–10.2)
Chloride: 102 mmol/L (ref 96–106)
Creatinine, Ser: 0.99 mg/dL (ref 0.76–1.27)
GFR calc Af Amer: 95 mL/min/{1.73_m2} (ref >59)
GFR, EST NON AFRICAN AMERICAN: 82 mL/min/{1.73_m2} (ref >59)
Glucose: 103 mg/dL — ABNORMAL HIGH (ref 65–99)
Potassium: 4.7 mmol/L (ref 3.5–5.2)
Sodium: 140 mmol/L (ref 134–144)

## 2016-03-08 LAB — HEPATIC FUNCTION PANEL
ALT: 22 IU/L (ref 0–44)
AST: 25 IU/L (ref 0–40)
Albumin: 4.4 g/dL (ref 3.6–4.8)
Alkaline Phosphatase: 92 IU/L (ref 39–117)
BILIRUBIN TOTAL: 0.5 mg/dL (ref 0.0–1.2)
BILIRUBIN, DIRECT: 0.16 mg/dL (ref 0.00–0.40)
Total Protein: 6.9 g/dL (ref 6.0–8.5)

## 2016-03-08 LAB — LIPID PANEL
Chol/HDL Ratio: 2.8 ratio units (ref 0.0–5.0)
Cholesterol, Total: 87 mg/dL — ABNORMAL LOW (ref 100–199)
HDL: 31 mg/dL — AB (ref >39)
LDL Calculated: 41 mg/dL (ref 0–99)
TRIGLYCERIDES: 77 mg/dL (ref 0–149)
VLDL Cholesterol Cal: 15 mg/dL (ref 5–40)

## 2016-03-08 LAB — PSA: PROSTATE SPECIFIC AG, SERUM: 1.3 ng/mL (ref 0.0–4.0)

## 2016-03-09 ENCOUNTER — Encounter: Payer: Self-pay | Admitting: Family Medicine

## 2016-05-22 ENCOUNTER — Encounter (HOSPITAL_COMMUNITY): Payer: Self-pay | Admitting: Emergency Medicine

## 2016-05-22 ENCOUNTER — Emergency Department (HOSPITAL_COMMUNITY)
Admission: EM | Admit: 2016-05-22 | Discharge: 2016-05-23 | Disposition: A | Discharge status: Home / Self Care | Payer: BLUE CROSS/BLUE SHIELD | Attending: Emergency Medicine | Admitting: Emergency Medicine

## 2016-05-22 DIAGNOSIS — M546 Pain in thoracic spine: Secondary | ICD-10-CM | POA: Insufficient documentation

## 2016-05-22 DIAGNOSIS — Z79899 Other long term (current) drug therapy: Secondary | ICD-10-CM | POA: Insufficient documentation

## 2016-05-22 DIAGNOSIS — I2510 Atherosclerotic heart disease of native coronary artery without angina pectoris: Secondary | ICD-10-CM | POA: Insufficient documentation

## 2016-05-22 DIAGNOSIS — Z7982 Long term (current) use of aspirin: Secondary | ICD-10-CM | POA: Insufficient documentation

## 2016-05-22 DIAGNOSIS — Z951 Presence of aortocoronary bypass graft: Secondary | ICD-10-CM | POA: Diagnosis not present

## 2016-05-22 DIAGNOSIS — M549 Dorsalgia, unspecified: Secondary | ICD-10-CM

## 2016-05-22 DIAGNOSIS — Z955 Presence of coronary angioplasty implant and graft: Secondary | ICD-10-CM | POA: Insufficient documentation

## 2016-05-22 NOTE — ED Triage Notes (Signed)
Pt. reports left upper back pain onset today , denies injury , no SOB or cough .

## 2016-05-22 NOTE — ED Provider Notes (Signed)
[facility]-EMERGENCY DEPT Provider Note   CSN: [id number] Arrival date & time: 05/22/16  2145     History   Chief Complaint Chief Complaint  Patient presents with  . Back Pain    HPI William Strickland is a 60 y.o. male.  William Strickland is a 60 y.o. Male who presents to the ED complaining of left upper back pain since 6 pm tonight. Patient reports he was sitting down when he began having left upper back pain. He is unable to identify alleviating or aggravating factors. He also thinks he may have some left upper chest wall discomfort. He denies any shortness of breath. He denies any injury or trauma to his back. He has a history of a previous CABG 14 years ago. He reports taking two 325 mg ASA prior to arrival that helped with his pain. He currently rates his pain at a 2/10 dull pain. He denies fevers, coughing, shortness of breath, palpitations, numbness, tingling, weakness, abdominal pain, nausea, vomiting, rashes, injury, history of cancer, history of IV drug use, loss of bladder control or loss of bowel control.    The history is provided by the patient and the spouse. No language interpreter was used.  Back Pain   Associated symptoms include chest pain. Pertinent negatives include no fever, no numbness, no headaches, no abdominal pain, no dysuria and no weakness.    Past Medical History:  Diagnosis Date  . CAD (coronary artery disease)    hx CABG 2004, last cath 06/2013 with patent grafts  . History of nuclear stress test 05/31/2010; 2013   non-ischemic ; normal perfusion  . Hyperlipidemia     Patient Active Problem List   Diagnosis Date Noted  . Unstable angina (HCC) 07/08/2013  . Chest pain, not due to CAD, grafts patent on cardiac cath, may be GI, if re occurs will try zantac 07/08/2013  . Coronary artery disease, 2004 CABG, cath 07/08/13 with patent grafts 02/02/2013  . Hyperlipidemia 02/02/2013  . DERANGEMENT MENISCUS 07/31/2008  . EFFUSION OF LOWER LEG JOINT 07/31/2008   . KNEE PAIN 07/31/2008    Past Surgical History:  Procedure Laterality Date  . CARDIAC CATHETERIZATION  06/2013   Patent grafts  . CORONARY ARTERY BYPASS GRAFT  2004   LIMA to LAD, LIMA to OM, vein to a diagonal branch, sequential vein to PDA and PLA  . LEFT HEART CATHETERIZATION WITH CORONARY ANGIOGRAM N/A 07/08/2013   Procedure: LEFT HEART CATHETERIZATION WITH CORONARY ANGIOGRAM;  Surgeon: [name], MD;  Location: [facility] CATH LAB;  Service: Cardiovascular;  Laterality: N/A;  . SHOULDER ARTHROSCOPY WITH SUBACROMIAL DECOMPRESSION, ROTATOR CUFF REPAIR AND BICEP TENDON REPAIR Left 07/27/2014   Procedure: LEFT SHOULDER ARTHROSCOPY WITH SUBACROMIAL DECOMPRESSION, DISTAL CLAVICLE RESECTION, ROTATOR CUFF REPAIR AND POSSIBLE BICEP TENODESIS;  Surgeon: [name], MD;  Location: [facility] OR;  Service: Orthopedics;  Laterality: Left;       Home Medications    Prior to Admission medications   Medication Sig Start Date End Date Taking? Authorizing Provider  aspirin 81 MG tablet Take 81 mg by mouth daily.   Yes Historical Provider, MD  atorvastatin (LIPITOR) 80 MG tablet Take 1 tablet (80 mg total) by mouth daily. 10/19/15  Yes [name], MD  metoprolol (LOPRESSOR) 50 MG tablet Take 0.5 tablets (25 mg total) by mouth 2 (two) times daily. 07/11/15  Yes [name], MD  niacin (NIASPAN) 1000 MG CR tablet Take 1 tablet (1,000 mg total) by mouth at bedtime. 07/11/15  Yes [name], MD  nitroGLYCERIN (NITROSTAT) 0.4 MG SL tablet Place 1 tablet (0.4 mg total) under the tongue every 5 (five) minutes as needed for chest pain. 07/11/15  Yes [name], MD  acetaminophen (TYLENOL) 325 MG tablet Take 2 tablets (650 mg total) by mouth every 6 (six) hours as needed for mild pain or moderate pain. 05/23/16   Everlene Farrier, PA-C    Family History Family History  Problem Relation Age of Onset  . Heart attack Mother   . Heart disease Mother   . Hypertension Mother   . Cancer  Mother   . Heart disease Father   . Heart attack Brother 42  . CAD Brother   . CAD    . Hyperlipidemia      Social History Social History  Substance Use Topics  . Smoking status: Never Smoker  . Smokeless tobacco: Never Used  . Alcohol use No     Allergies   Review of patient's allergies indicates no known allergies.   Review of Systems Review of Systems  Constitutional: Negative for chills and fever.  HENT: Negative for congestion and sore throat.   Eyes: Negative for visual disturbance.  Respiratory: Negative for cough, shortness of breath and wheezing.   Cardiovascular: Positive for chest pain. Negative for palpitations and leg swelling.  Gastrointestinal: Negative for abdominal pain, diarrhea, nausea and vomiting.  Genitourinary: Negative for dysuria.  Musculoskeletal: Positive for back pain. Negative for neck pain.  Skin: Negative for rash.  Neurological: Negative for syncope, weakness, light-headedness, numbness and headaches.     Physical Exam Updated Vital Signs BP 135/86   Pulse 65   Temp 97.6 F (36.4 C) (Oral)   Resp 18   Ht 6' (1.829 m)   Wt 106.6 kg   SpO2 95%   BMI 31.87 kg/m   Physical Exam  Constitutional: He is oriented to person, place, and time. He appears well-developed and well-nourished. No distress.  Nontoxic appearing.  HENT:  Head: Normocephalic and atraumatic.  Mouth/Throat: Oropharynx is clear and moist.  Eyes: Conjunctivae are normal. Pupils are equal, round, and reactive to light. Right eye exhibits no discharge. Left eye exhibits no discharge.  Neck: Normal range of motion. Neck supple. No JVD present. No tracheal deviation present.  Cardiovascular: Normal rate, regular rhythm, normal heart sounds and intact distal pulses.  Exam reveals no gallop and no friction rub.   No murmur heard. Bilateral radial and posterior tibialis pulses are intact.  Pulmonary/Chest: Effort normal and breath sounds normal. No stridor. No respiratory  distress. He has no wheezes. He has no rales. He exhibits no tenderness.  Lungs clear to auscultation bilaterally. No chest wall tenderness to palpation.  Abdominal: Soft. There is no tenderness. There is no guarding.  Musculoskeletal: Normal range of motion. He exhibits tenderness. He exhibits no edema.  Mild tenderness to the patient's left rhomboid musculature. No midline back tenderness. No back erythema, deformity, ecchymosis or warmth. Good strength in his bilateral upper extremities.  Lymphadenopathy:    He has no cervical adenopathy.  Neurological: He is alert and oriented to person, place, and time. Coordination normal.  Skin: Skin is warm and dry. Capillary refill takes less than 2 seconds. No rash noted. He is not diaphoretic. No erythema. No pallor.  Psychiatric: He has a normal mood and affect. His behavior is normal.  Nursing note and vitals reviewed.    ED Treatments / Results  Labs (all labs ordered are listed, but only abnormal  results are displayed) Labs Reviewed  BASIC METABOLIC PANEL - Abnormal; Notable for the following:       Result Value   Glucose, Bld 106 (*)    All other components within normal limits  CBC WITH DIFFERENTIAL/PLATELET  Rosezena Sensor, ED  Rosezena Sensor, ED    EKG  EKG Interpretation  Date/Time:  Thursday May 22 2016 21:51:15 EDT Ventricular Rate:  89 PR Interval:  166 QRS Duration: 82 QT Interval:  368 QTC Calculation: 447 R Axis:     Text Interpretation:  Normal sinus rhythm Possible Anterior infarct , age undetermined Abnormal ECG When compared with ECG of 07/09/2013, No significant change was found Confirmed by [facility]  MD, DAVID ([id number]) on 05/23/2016 12:39:13 AM       Radiology Dg Chest 2 View  Result Date: 05/23/2016 CLINICAL DATA:  Chest pain, history of CABG EXAM: CHEST  2 VIEW COMPARISON:  None. FINDINGS: Postoperative changes of the mediastinum. Streaky opacities at the left base could reflect atelectasis. No acute  infiltrate or effusion. There is mild cardiomegaly. Mild tortuosity of the aorta. No pneumothorax. Degenerative changes of the spine. IMPRESSION: Postsurgical changes of the mediastinum. There is mild cardiomegaly but no overt edema or acute infiltrate. Electronically Signed   By: Jasmine Pang M.D.   On: 05/23/2016 00:36    Procedures Procedures (including critical care time)  Medications Ordered in ED Medications - No data to display   Initial Impression / Assessment and Plan / ED Course  I have reviewed the triage vital signs and the nursing notes.  Pertinent labs & imaging results that were available during my care of the patient were reviewed by me and considered in my medical decision making (see chart for details).  Clinical Course   This  is a 60 y.o. Male who presents to the ED complaining of left upper back pain since 6 pm tonight. Patient reports he was sitting down when he began having left upper back pain. He is unable to identify alleviating or aggravating factors. He also thinks he may have some left upper chest wall discomfort. He denies any shortness of breath. He denies any injury or trauma to his back. He has a history of a previous CABG 14 years ago. He reports taking two 325 mg ASA prior to arrival that helped with his pain. He currently rates his pain at a 2/10 dull pain. He denies fevers, coughing, shortness of breath, palpitations, numbness, tingling, weakness.  On exam the patient is afebrile nontoxic appearing. He does have tenderness to his left rhomboid musculature. No midline neck or back tenderness. No overlying skin changes to his back. His lungs are clear to auscultation bilaterally. He has no focal neurological deficits. I have low suspicion for ACS, but I would like to ensure that this is not his chest pain equivalent. Will obtain EKG, blood work, troponin and chest x-ray. EKG shows no significant change from his last tracing. CBC and BMP are unremarkable.  Initial troponin is not elevated. Chest x-ray shows mild cardiomegaly but no overt edema or acute infiltrate. We'll obtain delta troponin. Second troponin is also not elevated. At recheck patient reports his pain has resolved. He feels ready for discharge. I encouraged him to follow-up with his cardiologist and discuss an exercise stress test. Discussed back exercises and methods to help with back pain. I discussed strict and specific return precautions. I advised the patient to follow-up with their primary care provider this week. I advised the patient to return  to the emergency department with new or worsening symptoms or new concerns. The patient verbalized understanding and agreement with plan.    This patient was discussed with Dr. Preston FleetingGlick who agrees with assessment and plan.   Final Clinical Impressions(s) / ED Diagnoses   Final diagnoses:  Upper back pain on left side    New Prescriptions New Prescriptions   ACETAMINOPHEN (TYLENOL) 325 MG TABLET    Take 2 tablets (650 mg total) by mouth every 6 (six) hours as needed for mild pain or moderate pain.     Everlene FarrierWilliam Rane Dumm, PA-C 05/23/16 [id number]    Dione Boozeavid Glick, MD 05/23/16 [phone]

## 2016-05-23 ENCOUNTER — Emergency Department (HOSPITAL_COMMUNITY): Payer: BLUE CROSS/BLUE SHIELD

## 2016-05-23 LAB — CBC WITH DIFFERENTIAL/PLATELET
Basophils Absolute: 0 10*3/uL (ref 0.0–0.1)
Basophils Relative: 0 %
EOS ABS: 0.1 10*3/uL (ref 0.0–0.7)
EOS PCT: 1 %
HCT: 42 % (ref 39.0–52.0)
Hemoglobin: 13.9 g/dL (ref 13.0–17.0)
LYMPHS ABS: 2.1 10*3/uL (ref 0.7–4.0)
Lymphocytes Relative: 34 %
MCH: 26.7 pg (ref 26.0–34.0)
MCHC: 33.1 g/dL (ref 30.0–36.0)
MCV: 80.8 fL (ref 78.0–100.0)
MONOS PCT: 8 %
Monocytes Absolute: 0.5 10*3/uL (ref 0.1–1.0)
Neutro Abs: 3.6 10*3/uL (ref 1.7–7.7)
Neutrophils Relative %: 57 %
PLATELETS: 237 10*3/uL (ref 150–400)
RBC: 5.2 MIL/uL (ref 4.22–5.81)
RDW: 13.5 % (ref 11.5–15.5)
WBC: 6.2 10*3/uL (ref 4.0–10.5)

## 2016-05-23 LAB — I-STAT TROPONIN, ED
TROPONIN I, POC: 0 ng/mL (ref 0.00–0.08)
Troponin i, poc: 0.01 ng/mL (ref 0.00–0.08)

## 2016-05-23 LAB — BASIC METABOLIC PANEL
Anion gap: 6 (ref 5–15)
BUN: 10 mg/dL (ref 6–20)
CALCIUM: 9.2 mg/dL (ref 8.9–10.3)
CHLORIDE: 107 mmol/L (ref 101–111)
CO2: 25 mmol/L (ref 22–32)
CREATININE: 0.82 mg/dL (ref 0.61–1.24)
GFR calc Af Amer: >60 mL/min (ref >60)
GFR calc non Af Amer: >60 mL/min (ref >60)
Glucose, Bld: 106 mg/dL — ABNORMAL HIGH (ref 65–99)
Potassium: 3.6 mmol/L (ref 3.5–5.1)
SODIUM: 138 mmol/L (ref 135–145)

## 2016-05-23 MED ORDER — ACETAMINOPHEN 325 MG PO TABS: 650.0000 mg | ORAL_TABLET | Freq: Four times a day (QID) | ORAL | 0 refills | Status: DC | PRN

## 2016-05-23 NOTE — ED Notes (Signed)
Patient transported to X-ray 

## 2016-07-11 ENCOUNTER — Ambulatory Visit (INDEPENDENT_AMBULATORY_CARE_PROVIDER_SITE_OTHER): Payer: BLUE CROSS/BLUE SHIELD | Admitting: Cardiovascular Disease

## 2016-07-11 ENCOUNTER — Encounter: Payer: Self-pay | Admitting: Cardiovascular Disease

## 2016-07-11 DIAGNOSIS — E7800 Pure hypercholesterolemia, unspecified: Secondary | ICD-10-CM

## 2016-07-11 DIAGNOSIS — I2510 Atherosclerotic heart disease of native coronary artery without angina pectoris: Secondary | ICD-10-CM

## 2016-07-11 MED ORDER — NITROGLYCERIN 0.4 MG SL SUBL: 0.4000 mg | SUBLINGUAL_TABLET | SUBLINGUAL | 1 refills | Status: DC | PRN

## 2016-07-11 NOTE — Assessment & Plan Note (Signed)
History of CAD status post bypass grafting in 2004 the LIMA graft was LAD, RIMA to obtuse marginal branch and a vein to the diagonal as well as sequential vein to the PDA and PLA. His last Myoview performed 06/04/12 was nonischemic. He denies chest pain or shortness of breath.

## 2016-07-11 NOTE — Progress Notes (Signed)
07/11/2016 Samara DeistJoseph M Govan   Dec 27, 1955  [id number]  Primary Physician Lubertha SouthSteve Luking, MD Primary Cardiologist: Runell GessJonathan J Jerusalem Wert MD Roseanne RenoFACP, FACC, FAHA, FSCAI  HPI:  Mr. William Strickland is a 60 year old mildly overweight widowed Caucasian male father of 3 children, grandfather to 6 grandchildren who I last saw in the office 07/11/15. He has a history of CAD status post coronary artery bypass grafting in 2004 with an LIMA graft to the LAD, RIMA graft to an OM branch, a vein to diagonal branch, and a sequential vein to the PDA/PL A. His other problems include hyperlipidemia. His recent lipid profile performed 03/07/16 revealed an LDL of 41 and HDL of 31. He had a nonischemic Myoview 06/04/12. He denies chest pain or shortness of breath.  Current Outpatient Prescriptions  Medication Sig Dispense Refill  . aspirin 81 MG tablet Take 81 mg by mouth daily.    Marland Kitchen. atorvastatin (LIPITOR) 80 MG tablet Take 1 tablet (80 mg total) by mouth daily. 30 tablet 9  . metoprolol (LOPRESSOR) 50 MG tablet Take 0.5 tablets (25 mg total) by mouth 2 (two) times daily. 90 tablet 3  . niacin (NIASPAN) 1000 MG CR tablet Take 1 tablet (1,000 mg total) by mouth at bedtime. 90 tablet 3  . nitroGLYCERIN (NITROSTAT) 0.4 MG SL tablet Place 1 tablet (0.4 mg total) under the tongue every 5 (five) minutes as needed for chest pain. 25 tablet 1   No current facility-administered medications for this visit.     No Known Allergies  Social History   Social History  . Marital status: Widowed    Spouse name: N/A  . Number of children: 3  . Years of education: N/A   Occupational History  . Not on file.   Social History Main Topics  . Smoking status: Never Smoker  . Smokeless tobacco: Never Used  . Alcohol use No  . Drug use: No  . Sexual activity: Yes    Partners: Male   Other Topics Concern  . Not on file   Social History Narrative  . No narrative on file     Review of Systems: General: negative for chills, fever,  night sweats or weight changes.  Cardiovascular: negative for chest pain, dyspnea on exertion, edema, orthopnea, palpitations, paroxysmal nocturnal dyspnea or shortness of breath Dermatological: negative for rash Respiratory: negative for cough or wheezing Urologic: negative for hematuria Abdominal: negative for nausea, vomiting, diarrhea, bright red blood per rectum, melena, or hematemesis Neurologic: negative for visual changes, syncope, or dizziness All other systems reviewed and are otherwise negative except as noted above.    Blood pressure 108/72, pulse 74, height 6' (1.829 m), weight 243 lb (110.2 kg).  General appearance: alert and no distress Neck: no adenopathy, no carotid bruit, no JVD, supple, symmetrical, trachea midline and thyroid not enlarged, symmetric, no tenderness/mass/nodules Lungs: clear to auscultation bilaterally Heart: regular rate and rhythm, S1, S2 normal, no murmur, click, rub or gallop Extremities: extremities normal, atraumatic, no cyanosis or edema  EKG not performed today  ASSESSMENT AND PLAN:   Coronary artery disease, 2004 CABG, cath 07/08/13 with patent grafts History of CAD status post bypass grafting in 2004 the LIMA graft was LAD, RIMA to obtuse marginal branch and a vein to the diagonal as well as sequential vein to the PDA and PLA. His last Myoview performed 06/04/12 was nonischemic. He denies chest pain or shortness of breath.  Hyperlipidemia History of hyperlipidemia on statin therapy with recent lipid profile performed 03/07/16 revealing  an LDL 41 and HDL 31.      Runell GessJonathan J. Kendyl Festa MD [facility] Surgery CenterFACP,FACC,FAHA, Adventhealth TampaFSCAI 07/11/2016 2:18 PM

## 2016-07-11 NOTE — Assessment & Plan Note (Signed)
History of hyperlipidemia on statin therapy with recent lipid profile performed 03/07/16 revealing an LDL 41 and HDL 31.

## 2016-07-11 NOTE — Patient Instructions (Signed)

## 2016-07-29 ENCOUNTER — Telehealth: Payer: Self-pay | Admitting: Internal Medicine

## 2016-07-29 NOTE — Telephone Encounter (Signed)
ON RECALL FOR TCS °

## 2016-07-29 NOTE — Telephone Encounter (Signed)
Letter mailed to pt.  

## 2016-08-06 ENCOUNTER — Telehealth: Payer: Self-pay

## 2016-08-06 NOTE — Telephone Encounter (Signed)
Patient received letter to schedule tcs [phone]

## 2016-08-11 ENCOUNTER — Telehealth: Payer: Self-pay

## 2016-08-11 NOTE — Telephone Encounter (Signed)
Triaged today.  

## 2016-08-18 ENCOUNTER — Other Ambulatory Visit: Payer: Self-pay | Admitting: Cardiovascular Disease

## 2016-08-19 ENCOUNTER — Other Ambulatory Visit: Payer: Self-pay

## 2016-08-19 DIAGNOSIS — Z1211 Encounter for screening for malignant neoplasm of colon: Secondary | ICD-10-CM

## 2016-08-19 NOTE — Telephone Encounter (Signed)
See separate triage.  

## 2016-08-19 NOTE — Telephone Encounter (Signed)
Gastroenterology Pre-Procedure Review  Request Date: 08/11/2016 Requesting Physician: ON RECALL  Last colonoscopy on 09/21/2006 by Dr. Jena Gaussourk  PATIENT REVIEW QUESTIONS: The patient responded to the following health history questions as indicated:    1. Diabetes Melitis: no 2. Joint replacements in the past 12 months: no 3. Major health problems in the past 3 months: no 4. Has an artificial valve or MVP: no 5. Has a defibrillator: no 6. Has been advised in past to take antibiotics in advance of a procedure like teeth cleaning: no 7. Family history of colon cancer: no  8. Alcohol Use: no 9. History of sleep apnea: no  10. History of coronary artery or other vascular stents placed within the last 12 months: no    MEDICATIONS & ALLERGIES:    Patient reports the following regarding taking any blood thinners:   Plavix? no Aspirin? YES Coumadin? no Brilinta? no Xarelto? no Eliquis? no Pradaxa? no Savaysa? no Effient? no  Patient confirms/reports the following medications:  Current Outpatient Prescriptions  Medication Sig Dispense Refill  . aspirin 81 MG tablet Take 81 mg by mouth daily.    Marland Kitchen. atorvastatin (LIPITOR) 80 MG tablet Take 1 tablet (80 mg total) by mouth daily. 30 tablet 9  . niacin (NIASPAN) 1000 MG CR tablet Take 1 tablet (1,000 mg total) by mouth at bedtime. 90 tablet 3  . metoprolol (LOPRESSOR) 50 MG tablet take 1/2 tablet by mouth twice a day 90 tablet 3  . nitroGLYCERIN (NITROSTAT) 0.4 MG SL tablet Place 1 tablet (0.4 mg total) under the tongue every 5 (five) minutes as needed for chest pain. (Patient not taking: Reported on 08/11/2016) 25 tablet 1   No current facility-administered medications for this visit.     Patient confirms/reports the following allergies:  No Known Allergies  No orders of the defined types were placed in this encounter.   AUTHORIZATION INFORMATION Primary Insurance:   ID #:  Group #:  Pre-Cert / Auth required: Pre-Cert / Auth #:    Secondary Insurance:   ID #:   Group #:  Pre-Cert / Auth required:  Pre-Cert / Auth #:   SCHEDULE INFORMATION: Procedure has been scheduled as follows:  Date: 09/03/2016                Time: 9:00 AM Location:  [facility] Short Stay  This Gastroenterology Pre-Precedure Review Form is being routed to the following provider(s): R. Roetta SessionsMichael Rourk, MD

## 2016-08-19 NOTE — Telephone Encounter (Signed)
Ok to schedule.

## 2016-08-20 MED ORDER — NA SULFATE-K SULFATE-MG SULF 17.5-3.13-1.6 GM/177ML PO SOLN: 1.0000 | ORAL | 0 refills | Status: DC

## 2016-08-20 MED ORDER — PEG 3350-KCL-NA BICARB-NACL 420 G PO SOLR: 4000.0000 mL | ORAL | 0 refills | Status: DC

## 2016-08-20 NOTE — Addendum Note (Signed)
Addended by: Lavena BullionSTEWART, Ashleynicole Mcclees H on: 08/20/2016 12:25 PM   Modules accepted: Orders

## 2016-08-20 NOTE — Addendum Note (Signed)
Addended by: Lavena BullionSTEWART, Genevieve Ritzel H on: 08/20/2016 01:09 PM   Modules accepted: Orders

## 2016-08-20 NOTE — Telephone Encounter (Signed)
Rx sent to the pharmacy and instructions mailed to pt.  

## 2016-08-20 NOTE — Telephone Encounter (Signed)
Suprep not covered. Sending in [place] and mailing these instructions. ( Took the previous instructions out of the mail).

## 2016-08-29 ENCOUNTER — Encounter: Payer: Self-pay | Admitting: Family Medicine

## 2016-08-29 ENCOUNTER — Ambulatory Visit (INDEPENDENT_AMBULATORY_CARE_PROVIDER_SITE_OTHER): Payer: BLUE CROSS/BLUE SHIELD | Admitting: Family Medicine

## 2016-08-29 ENCOUNTER — Encounter (HOSPITAL_COMMUNITY): Payer: Self-pay | Admitting: *Deleted

## 2016-08-29 VITALS — BP 118/74 | Ht 72.0 in | Wt 251.0 lb

## 2016-08-29 DIAGNOSIS — Z79899 Other long term (current) drug therapy: Secondary | ICD-10-CM | POA: Diagnosis not present

## 2016-08-29 DIAGNOSIS — E785 Hyperlipidemia, unspecified: Secondary | ICD-10-CM

## 2016-08-29 DIAGNOSIS — Z0000 Encounter for general adult medical examination without abnormal findings: Secondary | ICD-10-CM | POA: Diagnosis not present

## 2016-08-29 DIAGNOSIS — N5201 Erectile dysfunction due to arterial insufficiency: Secondary | ICD-10-CM | POA: Diagnosis not present

## 2016-08-29 MED ORDER — SILDENAFIL CITRATE 100 MG PO TABS: 50.0000 mg | ORAL_TABLET | Freq: Every day | ORAL | 11 refills | Status: DC | PRN

## 2016-08-29 NOTE — Progress Notes (Signed)
   Subjective:    Patient ID: William Strickland, male    DOB: [birth year], 61 y.o.   MRN: [id number]  Hyperlipidemia  This is a new problem. The current episode started more than 1 year ago. Treatments tried: atorvastatin. Compliance problems include adherence to exercise.    Results for orders placed or performed during the hospital encounter of 05/22/16  Basic metabolic panel  Result Value Ref Range   Sodium 138 135 - 145 mmol/L   Potassium 3.6 3.5 - 5.1 mmol/L   Chloride 107 101 - 111 mmol/L   CO2 25 22 - 32 mmol/L   Glucose, Bld 106 (H) 65 - 99 mg/dL   BUN 10 6 - 20 mg/dL   Creatinine, Ser 2.130.82 0.61 - 1.24 mg/dL   Calcium 9.2 8.9 - 08.610.3 mg/dL   GFR calc non Af Amer >60 >60 mL/min   GFR calc Af Amer >60 >60 mL/min   Anion gap 6 5 - 15  CBC with Differential  Result Value Ref Range   WBC 6.2 4.0 - 10.5 K/uL   RBC 5.20 4.22 - 5.81 MIL/uL   Hemoglobin 13.9 13.0 - 17.0 g/dL   HCT 57.842.0 46.939.0 - 62.952.0 %   MCV 80.8 78.0 - 100.0 fL   MCH 26.7 26.0 - 34.0 pg   MCHC 33.1 30.0 - 36.0 g/dL   RDW 52.813.5 41.311.5 - 24.415.5 %   Platelets 237 150 - 400 K/uL   Neutrophils Relative % 57 %   Neutro Abs 3.6 1.7 - 7.7 K/uL   Lymphocytes Relative 34 %   Lymphs Abs 2.1 0.7 - 4.0 K/uL   Monocytes Relative 8 %   Monocytes Absolute 0.5 0.1 - 1.0 K/uL   Eosinophils Relative 1 %   Eosinophils Absolute 0.1 0.0 - 0.7 K/uL   Basophils Relative 0 %   Basophils Absolute 0.0 0.0 - 0.1 K/uL  I-stat troponin, ED  Result Value Ref Range   Troponin i, poc 0.00 0.00 - 0.08 ng/mL   Comment 3          I-stat troponin, ED  Result Value Ref Range   Troponin i, poc 0.01 0.00 - 0.08 ng/mL   Comment 3           .Patient continues to take lipid medication regularly. No obvious side effects from it. Generally does not miss a dose. Prior blood work results are reviewed with patient. Patient continues to work on fat intake in diet  Patient also notes challenges with erectile dysfunction. Has used Viagra in the past. 50 mg  one half 100 tablet does well. Has nitroglycerin but rarely uses   Review of Systems   No headache, no major weight loss or weight gain, no chest pain no back pain abdominal pain no change in bowel habits complete ROS otherwise negative       Objective:   Physical Exam Alert vitals stable, NAD. Blood pressure good on repeat. HEENT normal. Lungs clear. Heart regular rate and rhythm.        Assessment & Plan:  Impression 1 hyperlipidemia with known history of coronary artery disease. Discussed. No blood work for nearly 6 months. #2 erectile dysfunction. Discussed. Patient encouraged not to use with nitroglycerin plan Viagra prescribed. Other meds refilled diet exercise discussed appropriate blood work recheck in 6 months WSL for wellness at that time

## 2016-09-02 ENCOUNTER — Other Ambulatory Visit: Payer: Self-pay

## 2016-09-02 ENCOUNTER — Telehealth: Payer: Self-pay

## 2016-09-02 ENCOUNTER — Telehealth: Payer: Self-pay | Admitting: Internal Medicine

## 2016-09-02 DIAGNOSIS — Z1211 Encounter for screening for malignant neoplasm of colon: Secondary | ICD-10-CM

## 2016-09-02 NOTE — Telephone Encounter (Signed)
Endo called to say that patient is on RMR's schedule for tomorrow (colonoscopy) and there's no consent order.

## 2016-09-02 NOTE — Telephone Encounter (Signed)
[facility] for screening colonoscopy as out patient.

## 2016-09-02 NOTE — Telephone Encounter (Signed)
cc'ed to pcp °

## 2016-09-02 NOTE — Telephone Encounter (Signed)
New orders entered 

## 2016-09-03 ENCOUNTER — Ambulatory Visit (HOSPITAL_COMMUNITY)
Admission: RE | Admit: 2016-09-03 | Discharge: 2016-09-03 | Disposition: A | Discharge status: Home / Self Care | Payer: BLUE CROSS/BLUE SHIELD | Source: Ambulatory Visit | Attending: Internal Medicine | Admitting: Internal Medicine

## 2016-09-03 ENCOUNTER — Encounter (HOSPITAL_COMMUNITY)
Admission: RE | Disposition: A | Discharge status: Home / Self Care | Payer: Self-pay | Source: Ambulatory Visit | Attending: Internal Medicine

## 2016-09-03 ENCOUNTER — Encounter (HOSPITAL_COMMUNITY): Payer: Self-pay | Admitting: *Deleted

## 2016-09-03 DIAGNOSIS — Z79899 Other long term (current) drug therapy: Secondary | ICD-10-CM | POA: Diagnosis not present

## 2016-09-03 DIAGNOSIS — E785 Hyperlipidemia, unspecified: Secondary | ICD-10-CM | POA: Insufficient documentation

## 2016-09-03 DIAGNOSIS — Z1211 Encounter for screening for malignant neoplasm of colon: Secondary | ICD-10-CM | POA: Insufficient documentation

## 2016-09-03 DIAGNOSIS — I2510 Atherosclerotic heart disease of native coronary artery without angina pectoris: Secondary | ICD-10-CM | POA: Insufficient documentation

## 2016-09-03 DIAGNOSIS — Z951 Presence of aortocoronary bypass graft: Secondary | ICD-10-CM | POA: Insufficient documentation

## 2016-09-03 DIAGNOSIS — Z1212 Encounter for screening for malignant neoplasm of rectum: Secondary | ICD-10-CM

## 2016-09-03 DIAGNOSIS — Z7982 Long term (current) use of aspirin: Secondary | ICD-10-CM | POA: Diagnosis not present

## 2016-09-03 HISTORY — PX: COLONOSCOPY: SHX5424

## 2016-09-03 SURGERY — COLONOSCOPY
Anesthesia: Moderate Sedation

## 2016-09-03 MED ORDER — MEPERIDINE HCL 100 MG/ML IJ SOLN
INTRAMUSCULAR | Status: DC | PRN
  Administered 2016-09-03: 50 mg via INTRAVENOUS
  Administered 2016-09-03: 25 mg via INTRAVENOUS

## 2016-09-03 MED ORDER — MEPERIDINE HCL 100 MG/ML IJ SOLN
INTRAMUSCULAR | Status: AC
  Filled 2016-09-03: qty 2

## 2016-09-03 MED ORDER — ONDANSETRON HCL 4 MG/2ML IJ SOLN
INTRAMUSCULAR | Status: DC | PRN
  Administered 2016-09-03: 4 mg via INTRAVENOUS

## 2016-09-03 MED ORDER — STERILE WATER FOR IRRIGATION IR SOLN
Status: DC | PRN
  Administered 2016-09-03: 2.5 mL

## 2016-09-03 MED ORDER — SODIUM CHLORIDE 0.9 % IV SOLN
INTRAVENOUS | Status: DC
  Administered 2016-09-03: 1000 mL via INTRAVENOUS

## 2016-09-03 MED ORDER — MIDAZOLAM HCL 5 MG/5ML IJ SOLN
INTRAMUSCULAR | Status: DC | PRN
  Administered 2016-09-03: 2 mg via INTRAVENOUS
  Administered 2016-09-03: 1 mg via INTRAVENOUS

## 2016-09-03 MED ORDER — ONDANSETRON HCL 4 MG/2ML IJ SOLN
INTRAMUSCULAR | Status: DC
Start: 2016-09-03 — End: 2016-09-03
  Filled 2016-09-03: qty 2

## 2016-09-03 MED ORDER — MIDAZOLAM HCL 5 MG/5ML IJ SOLN
INTRAMUSCULAR | Status: AC
  Filled 2016-09-03: qty 10

## 2016-09-03 NOTE — Discharge Instructions (Signed)
Colonoscopy, Adult, Care After This sheet gives you information about how to care for yourself after your procedure. Your health care provider may also give you more specific instructions. If you have problems or questions, contact your health care provider. What can I expect after the procedure? After the procedure, it is common to have:  A small amount of blood in your stool for 24 hours after the procedure.  Some gas.  Mild abdominal cramping or bloating. Follow these instructions at home: General instructions  For the first 24 hours after the procedure:  Do not drive or use machinery.  Do not sign important documents.  Do not drink alcohol.  Do your regular daily activities at a slower pace than normal.  Eat soft, easy-to-digest foods.  Rest often.  Take over-the-counter or prescription medicines only as told by your health care provider.  It is up to you to get the results of your procedure. Ask your health care provider, or the department performing the procedure, when your results will be ready. Relieving cramping and bloating  Try walking around when you have cramps or feel bloated.  Apply heat to your abdomen as told by your health care provider. Use a heat source that your health care provider recommends, such as a moist heat pack or a heating pad.  Place a towel between your skin and the heat source.  Leave the heat on for 20-30 minutes.  Remove the heat if your skin turns bright red. This is especially important if you are unable to feel pain, heat, or cold. You may have a greater risk of getting burned. Eating and drinking  Drink enough fluid to keep your urine clear or pale yellow.  Resume your normal diet as instructed by your health care provider. Avoid heavy or fried foods that are hard to digest.  Avoid drinking alcohol for as long as instructed by your health care provider. Contact a health care provider if:  You have blood in your stool 2-3 days  after the procedure. Get help right away if:  You have more than a small spotting of blood in your stool.  You pass large blood clots in your stool.  Your abdomen is swollen.  You have nausea or vomiting.  You have a fever.  You have increasing abdominal pain that is not relieved with medicine.   Normal Exam, Repeat Colonoscopy in 10 years.

## 2016-09-03 NOTE — Op Note (Signed)
[facility] Patient Name: William Strickland Procedure Date: 09/03/2016 8:58 AM MRN: [id number] Date of Birth: 08-02-1955 Attending MD: Gennette Pac , MD CSN: [id number] Age: 61 Admit Type: Outpatient Procedure:                Colonoscopy - Screening Indications:              Screening for colorectal malignant neoplasm Providers:                Gennette Pac, MD, Nena Polio, RN, Dyann Ruddle Referring MD:              Medicines:                Midazolam 3 mg IV, Meperidine 75 mg IV, Ondansetron                            4 mg IV Complications:            No immediate complications. Estimated Blood Loss:     Estimated blood loss: none. Procedure:                Pre-Anesthesia Assessment:                           - Prior to the procedure, a History and Physical                            was performed, and patient medications and                            allergies were reviewed. The patient's tolerance of                            previous anesthesia was also reviewed. The risks                            and benefits of the procedure and the sedation                            options and risks were discussed with the patient.                            All questions were answered, and informed consent                            was obtained. Prior Anticoagulants: The patient has                            taken no previous anticoagulant or antiplatelet                            agents. After reviewing the risks and benefits, the  patient was deemed in satisfactory condition to                            undergo the procedure.                           After obtaining informed consent, the colonoscope                            was passed under direct vision. Throughout the                            procedure, the patient's blood pressure, pulse, and                            oxygen saturations were monitored  continuously. The                            EC-[id number] ([id number]) scope was introduced through                            the anus and advanced to the the cecum, identified                            by appendiceal orifice and ileocecal valve. The                            colonoscopy was performed without difficulty. The                            patient tolerated the procedure well. The quality                            of the bowel preparation was adequate. The                            ileocecal valve, appendiceal orifice, and rectum                            were photographed. The ileocecal valve, appendiceal                            orifice, and rectum were photographed. Scope In: 9:30:26 AM Scope Out: 9:45:56 AM Scope Withdrawal Time: 0 hours 10 minutes 57 seconds  Total Procedure Duration: 0 hours 15 minutes 30 seconds  Findings:      The perianal and digital rectal examinations were normal.      The colon (entire examined portion) appeared normal.      The exam was otherwise without abnormality on direct and retroflexion       views. Impression:               - The entire examined colon is normal.                           - The examination  was otherwise normal on direct                            and retroflexion views.                           - No specimens collected. Moderate Sedation:      Moderate (conscious) sedation was administered by the endoscopy nurse       and supervised by the endoscopist. The following parameters were       monitored: oxygen saturation, heart rate, blood pressure, respiratory       rate, EKG, adequacy of pulmonary ventilation, and response to care.       Total physician intraservice time was 22 minutes. Recommendation:           - Patient has a contact number available for                            emergencies. The signs and symptoms of potential                            delayed complications were discussed with the                             patient. Return to normal activities tomorrow.                            Written discharge instructions were provided to the                            patient.                           - Resume previous diet.                           - Continue present medications.                           - Repeat colonoscopy in 10 years for screening                            purposes.                           - Return to GI office PRN. Procedure Code(s):        --- Professional ---                           [id number], Colonoscopy, flexible; diagnostic, including                            collection of specimen(s) by brushing or washing,                            when performed (separate procedure)  16109, Moderate sedation services provided by the                            same physician or other qualified health care                            professional performing the diagnostic or                            therapeutic service that the sedation supports,                            requiring the presence of an independent trained                            observer to assist in the monitoring of the                            patient's level of consciousness and physiological                            status; initial 15 minutes of intraservice time,                            patient age 31 years or older Diagnosis Code(s):        --- Professional ---                           Z12.11, Encounter for screening for malignant                            neoplasm of colon CPT copyright 2016 American Medical Association. All rights reserved. The codes documented in this report are preliminary and upon coder review may  be revised to meet current compliance requirements. Gerrit Friends. Almina Schul, MD Gennette Pac, MD 09/03/2016 9:52:09 AM This report has been signed electronically. Number of Addenda: 0

## 2016-09-03 NOTE — H&P (Signed)
@LOGO @   Primary Care Physician:  Lubertha South, MD Primary Gastroenterologist:  Dr. Geni Bers  Pre-Procedure History & Physical: HPI:  William Strickland is a 61 y.o. male is here for a screening colonoscopy. Colonoscopy 10 years ago. No bowel symptoms. Family history of colon cancer.  Past Medical History:  Diagnosis Date  . CAD (coronary artery disease)    hx CABG 2004, last cath 06/2013 with patent grafts  . History of nuclear stress test 05/31/2010; 2013   non-ischemic ; normal perfusion  . Hyperlipidemia     Past Surgical History:  Procedure Laterality Date  . CARDIAC CATHETERIZATION  06/2013   Patent grafts  . CORONARY ARTERY BYPASS GRAFT  2004   LIMA to LAD, LIMA to OM, vein to a diagonal branch, sequential vein to PDA and PLA  . LEFT HEART CATHETERIZATION WITH CORONARY ANGIOGRAM N/A 07/08/2013   Procedure: LEFT HEART CATHETERIZATION WITH CORONARY ANGIOGRAM;  Surgeon: [name], MD;  Location: [facility] CATH LAB;  Service: Cardiovascular;  Laterality: N/A;  . SHOULDER ARTHROSCOPY WITH SUBACROMIAL DECOMPRESSION, ROTATOR CUFF REPAIR AND BICEP TENDON REPAIR Left 07/27/2014   Procedure: LEFT SHOULDER ARTHROSCOPY WITH SUBACROMIAL DECOMPRESSION, DISTAL CLAVICLE RESECTION, ROTATOR CUFF REPAIR AND POSSIBLE BICEP TENODESIS;  Surgeon: [name], MD;  Location: [facility] OR;  Service: Orthopedics;  Laterality: Left;    Prior to Admission medications   Medication Sig Start Date End Date Taking? Authorizing Provider  aspirin 81 MG tablet Take 81 mg by mouth daily.   Yes Historical Provider, MD  atorvastatin (LIPITOR) 80 MG tablet Take 1 tablet (80 mg total) by mouth daily. Patient taking differently: Take 80 mg by mouth every evening.  10/19/15  Yes [name], MD  metoprolol (LOPRESSOR) 50 MG tablet take 1/2 tablet by mouth twice a day 08/18/16  Yes [name], MD  niacin (NIASPAN) 1000 MG CR tablet Take 1 tablet (1,000 mg total) by mouth at bedtime. 07/11/15  Yes [name], MD   nitroGLYCERIN (NITROSTAT) 0.4 MG SL tablet Place 1 tablet (0.4 mg total) under the tongue every 5 (five) minutes as needed for chest pain. 07/11/16   [name], MD  sildenafil (VIAGRA) 100 MG tablet Take 0.5-1 tablets (50-100 mg total) by mouth daily as needed for erectile dysfunction. 08/29/16   [name], MD    Allergies as of 08/19/2016  . (No Known Allergies)    Family History  Problem Relation Age of Onset  . Heart attack Mother   . Heart disease Mother   . Hypertension Mother   . Cancer Mother   . Heart disease Father   . Heart attack Brother 42  . CAD Brother   . CAD    . Hyperlipidemia    . Prostate cancer Brother     Social History   Social History  . Marital status: Widowed    Spouse name: N/A  . Number of children: 3  . Years of education: N/A   Occupational History  . Not on file.   Social History Main Topics  . Smoking status: Never Smoker  . Smokeless tobacco: Never Used  . Alcohol use No  . Drug use: No  . Sexual activity: Yes    Partners: Male   Other Topics Concern  . Not on file   Social History Narrative  . No narrative on file    Review of Systems: See HPI, otherwise negative ROS  Physical Exam: BP 115/74   Pulse 73   Temp  97.7 F (36.5 C) (Oral)   Resp 16   Ht 6' (1.829 m)   Wt 240 lb (108.9 kg)   SpO2 95%   BMI 32.55 kg/m  General:   Alert,  Well-developed, well-nourished, pleasant and cooperative in NAD mal. Neck:  Supple; no masses or thyromegaly. Lungs:  Clear throughout to auscultation.   No wheezes, crackles, or rhonchi. No acute distress. Heart:  Regular rate and rhythm; no murmurs, clicks, rubs,  or gallops. Abdomen:  Soft, nontender and nondistended. No masses, hepatosplenomegaly or hernias noted. Normal bowel sounds, without guarding, and without rebound.    Impression/Plan: William Strickland is now here to undergo a screening colonoscopy.  Average risk screening examination.  Risks, benefits,  limitations, imponderables and alternatives regarding colonoscopy have been reviewed with the patient. Questions have been answered. All parties agreeable.           Notice:  This dictation was prepared with Dragon dictation along with smaller phrase technology. Any transcriptional errors that result from this process are unintentional and may not be corrected upon review.

## 2016-09-05 ENCOUNTER — Encounter (HOSPITAL_COMMUNITY): Payer: Self-pay | Admitting: Internal Medicine

## 2016-09-08 ENCOUNTER — Other Ambulatory Visit: Payer: Self-pay | Admitting: Cardiovascular Disease

## 2016-09-23 ENCOUNTER — Other Ambulatory Visit: Payer: Self-pay | Admitting: Cardiovascular Disease

## 2016-10-15 LAB — LIPID PANEL
CHOLESTEROL TOTAL: 106 mg/dL (ref 100–199)
Chol/HDL Ratio: 3.1 ratio units (ref 0.0–5.0)
HDL: 34 mg/dL — AB (ref >39)
LDL Calculated: 55 mg/dL (ref 0–99)
TRIGLYCERIDES: 84 mg/dL (ref 0–149)
VLDL CHOLESTEROL CAL: 17 mg/dL (ref 5–40)

## 2016-10-15 LAB — GLUCOSE, RANDOM: GLUCOSE: 89 mg/dL (ref 65–99)

## 2016-10-15 LAB — HEPATIC FUNCTION PANEL
ALK PHOS: 99 IU/L (ref 39–117)
ALT: 23 IU/L (ref 0–44)
AST: 22 IU/L (ref 0–40)
Albumin: 4.5 g/dL (ref 3.6–4.8)
Bilirubin Total: 0.6 mg/dL (ref 0.0–1.2)
Bilirubin, Direct: 0.17 mg/dL (ref 0.00–0.40)
Total Protein: 6.7 g/dL (ref 6.0–8.5)

## 2016-10-19 ENCOUNTER — Encounter: Payer: Self-pay | Admitting: Family Medicine

## 2016-12-16 IMAGING — DX DG CHEST 2V
2 series · 2 of 2 positions shown · non-contrast
Comparison: None.

CLINICAL DATA: Chest pain, history of CABG

EXAM:
CHEST  2 VIEW

[chest pa]
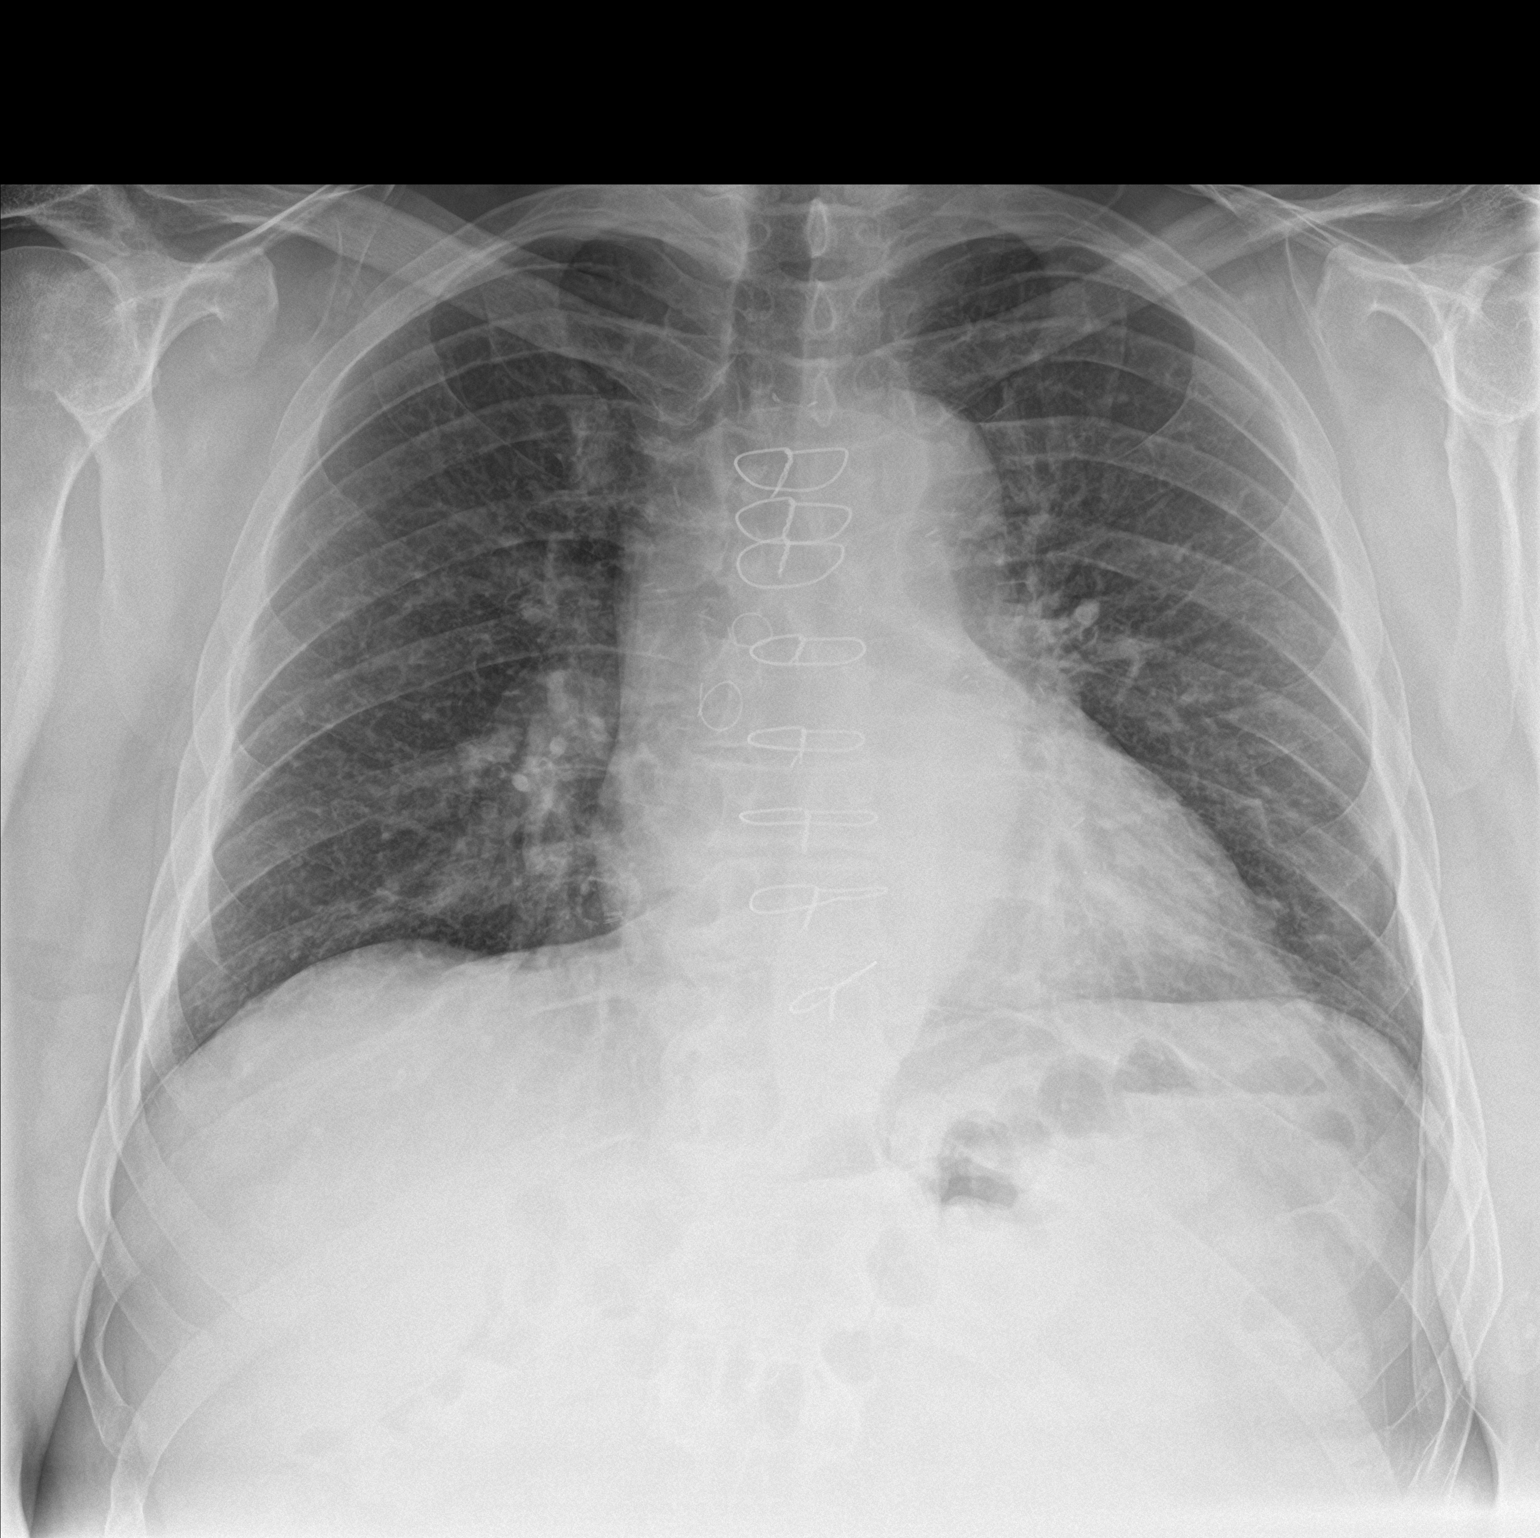

[chest lat]
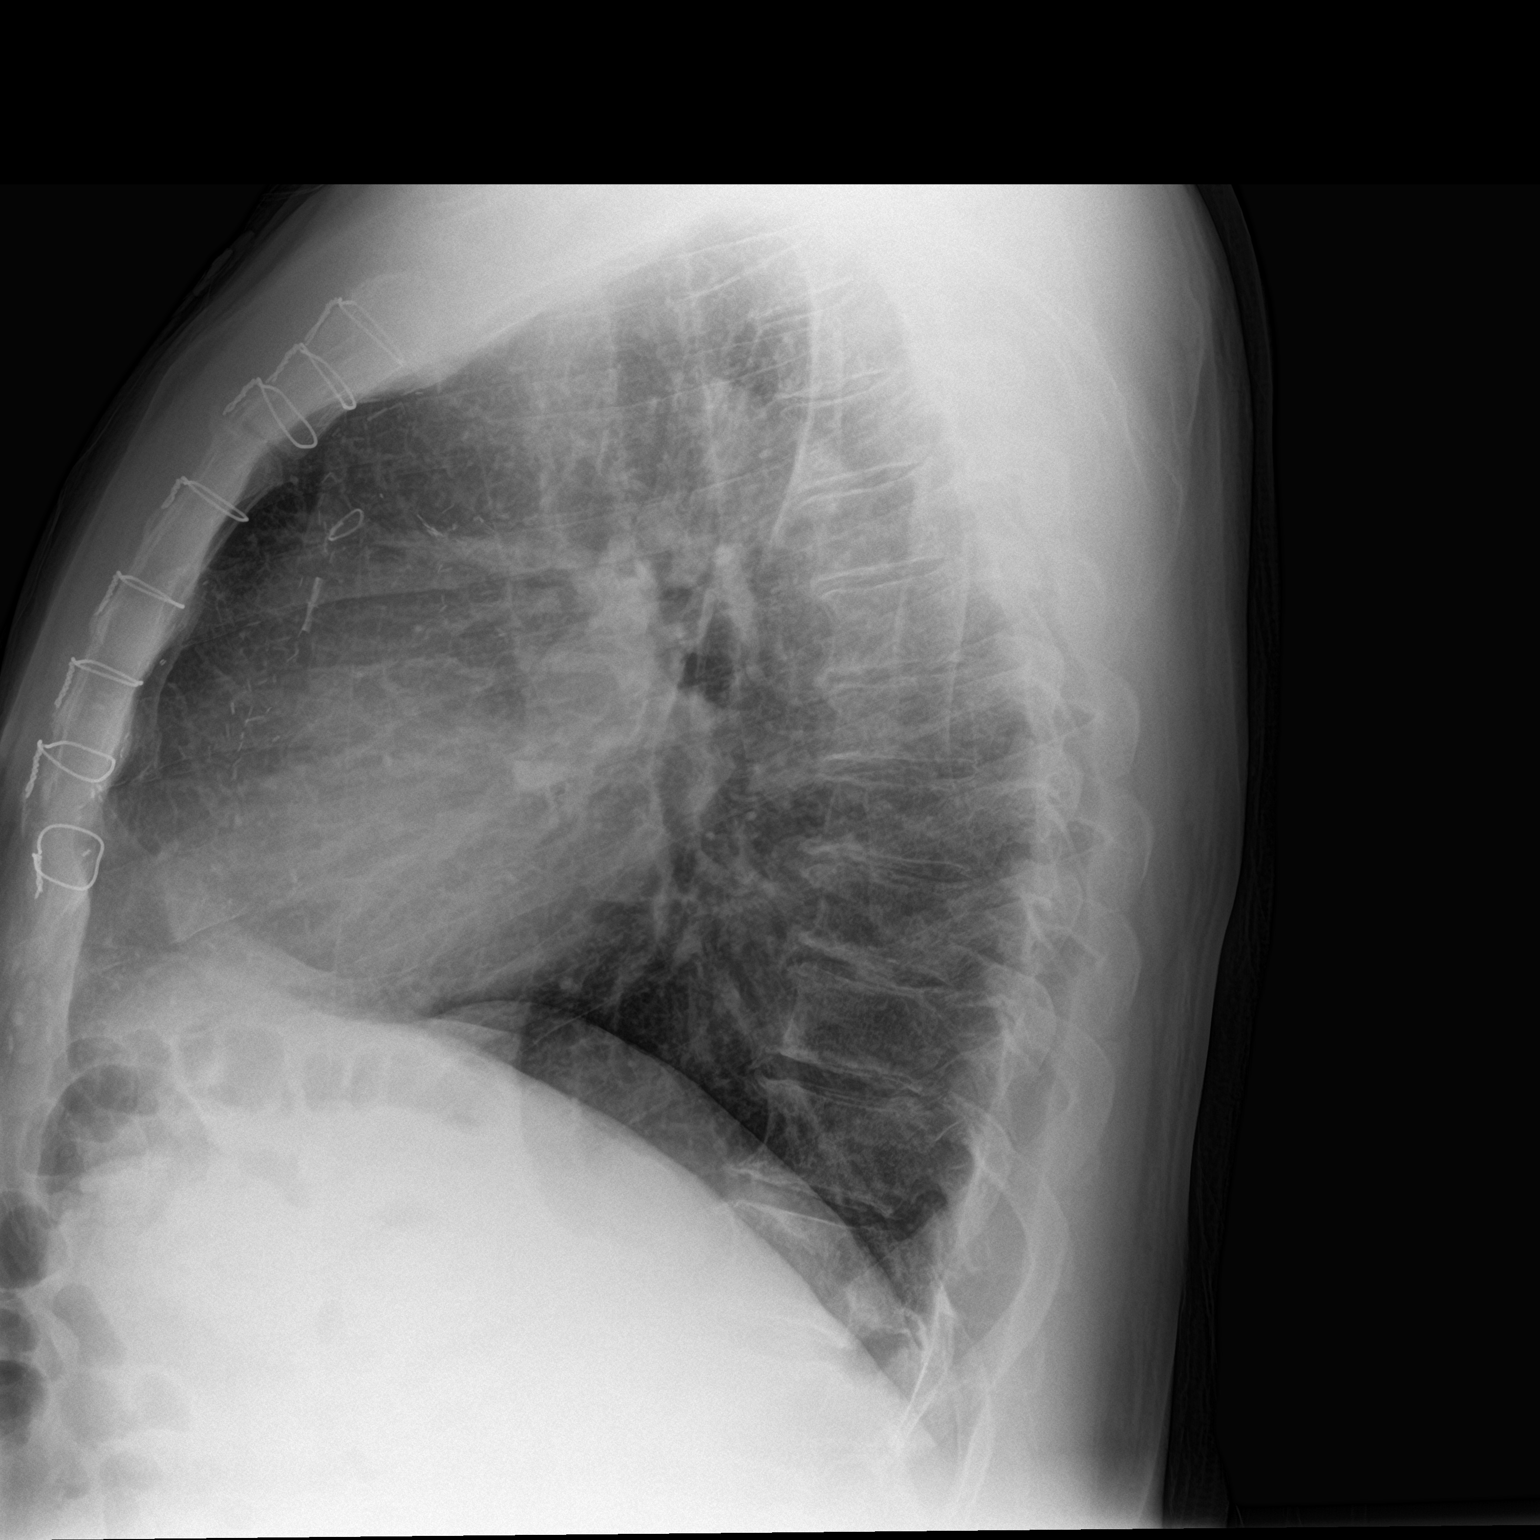

[2 of 2 positions shown; findings below may reference images not displayed]

FINDINGS: Postoperative changes of the mediastinum. Streaky opacities at the
left base could reflect atelectasis. No acute infiltrate or
effusion. There is mild cardiomegaly. Mild tortuosity of the aorta.
No pneumothorax. Degenerative changes of the spine.
IMPRESSION: Postsurgical changes of the mediastinum. There is mild cardiomegaly
but no overt edema or acute infiltrate.

## 2017-02-26 ENCOUNTER — Ambulatory Visit: Payer: BLUE CROSS/BLUE SHIELD | Admitting: Family Medicine

## 2017-03-02 ENCOUNTER — Encounter: Payer: Self-pay | Admitting: Family Medicine

## 2017-03-02 ENCOUNTER — Ambulatory Visit (INDEPENDENT_AMBULATORY_CARE_PROVIDER_SITE_OTHER): Payer: BLUE CROSS/BLUE SHIELD | Admitting: Family Medicine

## 2017-03-02 VITALS — BP 126/88 | Ht 72.0 in | Wt 248.1 lb

## 2017-03-02 DIAGNOSIS — R5383 Other fatigue: Secondary | ICD-10-CM

## 2017-03-02 DIAGNOSIS — Z79899 Other long term (current) drug therapy: Secondary | ICD-10-CM

## 2017-03-02 DIAGNOSIS — R079 Chest pain, unspecified: Secondary | ICD-10-CM | POA: Diagnosis not present

## 2017-03-02 DIAGNOSIS — Z0000 Encounter for general adult medical examination without abnormal findings: Secondary | ICD-10-CM | POA: Diagnosis not present

## 2017-03-02 DIAGNOSIS — N529 Male erectile dysfunction, unspecified: Secondary | ICD-10-CM

## 2017-03-02 DIAGNOSIS — E785 Hyperlipidemia, unspecified: Secondary | ICD-10-CM | POA: Diagnosis not present

## 2017-03-02 NOTE — Progress Notes (Signed)
Subjective:    Patient ID: William Strickland, male    DOB: 12/28/1955, 61 y.o.   MRN: [id number]  Hyperlipidemia  This is a chronic problem. The problem is controlled. Pertinent negatives include no chest pain.  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   Wide open , coveri, stay active an d does fulll exercise year round  colonoscoy done and to do in six yrs  Notes diminished energy, progressive over a couple yrs, maye worse in thr heat, gets enough sleep   No problems with depression  Shingles vaccine received  Patient continues to take lipid medication regularly. No obvious side effects from it. Generally does not miss a dose. Prior blood work results are reviewed with patient. Patient continues to work on fat intake in diet  Fair diet   Adult wellness-complete.wellness physical was conducted today. Importance of diet and exercise were discussed in detail. In addition to this a discussion regarding safety was also covered. We also reviewed over immunizations and gave recommendations regarding current immunization needed for age. In addition to this additional areas were also touched on including: Preventative health exams needed: Colonoscopy see above  Patient was advised yearly wellness exam  The patient comes in today for a wellness visit.    A review of their health history was completed.  A review of medications was also completed.  Any needed refills; followed by card  Eating habits: discussed , good  Falls/  MVA accidents in past few months: none  Regular exercise: yes  Specialist pt sees on regular basis: card  Preventative health issues were discussed.   Additional concerns: none  Review of Systems  Constitutional: Negative for activity change, appetite change and fever.  HENT: Negative for congestion and rhinorrhea.   Eyes: Negative for discharge.  Respiratory: Negative for cough and wheezing.   Cardiovascular: Negative for chest  pain.  Gastrointestinal: Negative for abdominal pain, blood in stool and vomiting.  Genitourinary: Negative for difficulty urinating and frequency.  Musculoskeletal: Negative for neck pain.  Skin: Negative for rash.  Allergic/Immunologic: Negative for environmental allergies and food allergies.  Neurological: Negative for weakness and headaches.  Psychiatric/Behavioral: Negative for agitation.  All other systems reviewed and are negative.      Objective:   Physical Exam  Constitutional: He appears well-developed and well-nourished.  HENT:  Head: Normocephalic and atraumatic.  Right Ear: External ear normal.  Left Ear: External ear normal.  Nose: Nose normal.  Mouth/Throat: Oropharynx is clear and moist.  Eyes: Pupils are equal, round, and reactive to light. EOM are normal.  Neck: Normal range of motion. Neck supple. No thyromegaly present.  Cardiovascular: Normal rate, regular rhythm and normal heart sounds.   No murmur heard. Pulmonary/Chest: Effort normal and breath sounds normal. No respiratory distress. He has no wheezes.  Abdominal: Soft. Bowel sounds are normal. He exhibits no distension and no mass. There is no tenderness.  Genitourinary: Penis normal.  Genitourinary Comments: Prostate within normal limits  Musculoskeletal: Normal range of motion. He exhibits no edema.  Lymphadenopathy:    He has no cervical adenopathy.  Neurological: He is alert. He exhibits normal muscle tone.  Skin: Skin is warm and dry. No erythema.  Psychiatric: He has a normal mood and affect. His behavior is normal. Judgment normal.  Vitals reviewed.         Assessment & Plan:  Impression well adult exam. Up-to-date on colonoscopy. Up-to-date on immunizations. Diet exercise discussed. Patient does have known coronary  artery disease importance of compliance with medications have a cardiologist reemphasize. Gen. questions answered. Follow-up in 6 months.

## 2017-03-10 LAB — BASIC METABOLIC PANEL
BUN / CREAT RATIO: 10 (ref 10–24)
BUN: 9 mg/dL (ref 8–27)
CO2: 25 mmol/L (ref 20–29)
Calcium: 9.1 mg/dL (ref 8.6–10.2)
Chloride: 105 mmol/L (ref 96–106)
Creatinine, Ser: 0.89 mg/dL (ref 0.76–1.27)
GFR calc Af Amer: 106 mL/min/{1.73_m2} (ref >59)
GFR, EST NON AFRICAN AMERICAN: 92 mL/min/{1.73_m2} (ref >59)
Glucose: 104 mg/dL — ABNORMAL HIGH (ref 65–99)
POTASSIUM: 4.8 mmol/L (ref 3.5–5.2)
SODIUM: 142 mmol/L (ref 134–144)

## 2017-03-10 LAB — LIPID PANEL
CHOLESTEROL TOTAL: 104 mg/dL (ref 100–199)
Chol/HDL Ratio: 3.5 ratio (ref 0.0–5.0)
HDL: 30 mg/dL — AB (ref >39)
LDL Calculated: 58 mg/dL (ref 0–99)
Triglycerides: 82 mg/dL (ref 0–149)
VLDL CHOLESTEROL CAL: 16 mg/dL (ref 5–40)

## 2017-03-10 LAB — CBC WITH DIFFERENTIAL/PLATELET
BASOS ABS: 0 10*3/uL (ref 0.0–0.2)
BASOS: 0 %
EOS (ABSOLUTE): 0.1 10*3/uL (ref 0.0–0.4)
EOS: 2 %
HEMATOCRIT: 40.4 % (ref 37.5–51.0)
HEMOGLOBIN: 13.6 g/dL (ref 13.0–17.7)
Immature Grans (Abs): 0 10*3/uL (ref 0.0–0.1)
Immature Granulocytes: 0 %
LYMPHS ABS: 1.6 10*3/uL (ref 0.7–3.1)
Lymphs: 35 %
MCH: 26.2 pg — AB (ref 26.6–33.0)
MCHC: 33.7 g/dL (ref 31.5–35.7)
MCV: 78 fL — AB (ref 79–97)
MONOCYTES: 7 %
Monocytes Absolute: 0.3 10*3/uL (ref 0.1–0.9)
NEUTROS ABS: 2.5 10*3/uL (ref 1.4–7.0)
Neutrophils: 56 %
Platelets: 278 10*3/uL (ref 150–379)
RBC: 5.19 x10E6/uL (ref 4.14–5.80)
RDW: 13.4 % (ref 12.3–15.4)
WBC: 4.5 10*3/uL (ref 3.4–10.8)

## 2017-03-10 LAB — HEPATIC FUNCTION PANEL
ALBUMIN: 4.5 g/dL (ref 3.6–4.8)
ALK PHOS: 99 IU/L (ref 39–117)
ALT: 18 IU/L (ref 0–44)
AST: 19 IU/L (ref 0–40)
Bilirubin Total: 0.5 mg/dL (ref 0.0–1.2)
Bilirubin, Direct: 0.13 mg/dL (ref 0.00–0.40)
Total Protein: 6.8 g/dL (ref 6.0–8.5)

## 2017-03-10 LAB — PSA: Prostate Specific Ag, Serum: 1.3 ng/mL (ref 0.0–4.0)

## 2017-03-10 LAB — TSH: TSH: 1.55 u[IU]/mL (ref 0.450–4.500)

## 2017-03-13 ENCOUNTER — Encounter: Payer: Self-pay | Admitting: Family Medicine

## 2017-07-14 ENCOUNTER — Ambulatory Visit: Payer: BLUE CROSS/BLUE SHIELD | Admitting: Cardiovascular Disease

## 2017-07-14 ENCOUNTER — Encounter: Payer: Self-pay | Admitting: Cardiovascular Disease

## 2017-07-14 VITALS — BP 112/78 | HR 62 | Ht 72.0 in | Wt 228.0 lb

## 2017-07-14 DIAGNOSIS — I2510 Atherosclerotic heart disease of native coronary artery without angina pectoris: Secondary | ICD-10-CM

## 2017-07-14 DIAGNOSIS — I200 Unstable angina: Secondary | ICD-10-CM | POA: Diagnosis not present

## 2017-07-14 DIAGNOSIS — E7800 Pure hypercholesterolemia, unspecified: Secondary | ICD-10-CM

## 2017-07-14 NOTE — Progress Notes (Signed)
07/14/2017 William Strickland   [birth year]  [id number]  Primary Physician Gerda DissLuking, Vilinda BlanksWilliam S, MD Primary Cardiologist: Runell GessJonathan J Derelle Cockrell MD FACP, MadelineFACC, BlueFAHA, [place]  HPI:  William Strickland is a 61 y.o.  mildly overweight widowed Caucasian male father of 3 children, grandfather to 6 grandchildren who I last saw in the office  07/11/16. He has a history of CAD status post coronary artery bypass grafting in 2004 with an LIMA graft to the LAD, RIMA graft to an OM branch, a vein to diagonal branch, and a sequential vein to the PDA/PL A. His other problems include hyperlipidemia. His recent lipid profile performed 03/09/17 revealed an LDL of 58 and HDL of 30. He had a nonischemic Myoview 06/04/12. He denies chest pain or shortness of breath.   Current Meds  Medication Sig  . aspirin 81 MG tablet Take 81 mg by mouth daily.  Marland Kitchen. atorvastatin (LIPITOR) 80 MG tablet take 1 tablet by mouth once daily  . metoprolol (LOPRESSOR) 50 MG tablet take 1/2 tablet by mouth twice a day  . niacin (NIASPAN) 1000 MG CR tablet take 1 tablet by mouth at bedtime  . nitroGLYCERIN (NITROSTAT) 0.4 MG SL tablet Place 1 tablet (0.4 mg total) under the tongue every 5 (five) minutes as needed for chest pain.  . sildenafil (VIAGRA) 100 MG tablet Take 0.5-1 tablets (50-100 mg total) by mouth daily as needed for erectile dysfunction.     No Known Allergies  Social History   Socioeconomic History  . Marital status: Widowed    Spouse name: Not on file  . Number of children: 3  . Years of education: Not on file  . Highest education level: Not on file  Social Needs  . Financial resource strain: Not on file  . Food insecurity - worry: Not on file  . Food insecurity - inability: Not on file  . Transportation needs - medical: Not on file  . Transportation needs - non-medical: Not on file  Occupational History  . Not on file  Tobacco Use  . Smoking status: Never Smoker  . Smokeless tobacco: Never Used  Substance and  Sexual Activity  . Alcohol use: No  . Drug use: No  . Sexual activity: Yes    Partners: Male  Other Topics Concern  . Not on file  Social History Narrative  . Not on file     Review of Systems: General: negative for chills, fever, night sweats or weight changes.  Cardiovascular: negative for chest pain, dyspnea on exertion, edema, orthopnea, palpitations, paroxysmal nocturnal dyspnea or shortness of breath Dermatological: negative for rash Respiratory: negative for cough or wheezing Urologic: negative for hematuria Abdominal: negative for nausea, vomiting, diarrhea, bright red blood per rectum, melena, or hematemesis Neurologic: negative for visual changes, syncope, or dizziness All other systems reviewed and are otherwise negative except as noted above.    Blood pressure 112/78, pulse 62, height 6' (1.829 m), weight 228 lb (103.4 kg).  General appearance: alert and no distress Neck: no adenopathy, no carotid bruit, no JVD, supple, symmetrical, trachea midline and thyroid not enlarged, symmetric, no tenderness/mass/nodules Lungs: clear to auscultation bilaterally Heart: regular rate and rhythm, S1, S2 normal, no murmur, click, rub or gallop Extremities: extremities normal, atraumatic, no cyanosis or edema Pulses: 2+ and symmetric Skin: Skin color, texture, turgor normal. No rashes or lesions Neurologic: Alert and oriented X 3, normal strength and tone. Normal symmetric reflexes. Normal coordination and gait  EKG sinus rhythm at 62  without ST or T-wave changes. I personally reviewed this EKG.  ASSESSMENT AND PLAN:   Coronary artery disease, 2004 CABG, cath 07/08/13 with patent grafts History of CAD status post coronary artery bypass grafting in 2004 the LIMA to his LAD, RIMA to an OM branch, vein to diagonal branch and sequential vein to the PDA and PLA. His last Myoview performed 06/04/12 was nonischemic. He denies chest pain or shortness of breath.  Hyperlipidemia History of  hyperlipidemia on statin therapy with lipid profile performed 03/09/17 revealing an LDL 58 and an HDL of 30.      Runell GessJonathan J. Akeira Lahm MD FACP,FACC,FAHA, [facility]ichael[facility]SCAI 07/14/2017 9:32 AM

## 2017-07-14 NOTE — Patient Instructions (Signed)

## 2017-07-14 NOTE — Assessment & Plan Note (Signed)
History of CAD status post coronary artery bypass grafting in 2004 the LIMA to his LAD, RIMA to an OM branch, vein to diagonal branch and sequential vein to the PDA and PLA. His last Myoview performed 06/04/12 was nonischemic. He denies chest pain or shortness of breath.

## 2017-07-14 NOTE — Assessment & Plan Note (Signed)
History of hyperlipidemia on statin therapy with lipid profile performed 03/09/17 revealing an LDL 58 and an HDL of 30.

## 2017-08-22 ENCOUNTER — Other Ambulatory Visit: Payer: Self-pay | Admitting: Cardiovascular Disease

## 2017-08-24 NOTE — Telephone Encounter (Signed)
Rx(s) sent to pharmacy electronically.  

## 2017-09-02 ENCOUNTER — Ambulatory Visit: Payer: BLUE CROSS/BLUE SHIELD | Admitting: Family Medicine

## 2017-09-03 ENCOUNTER — Encounter: Payer: Self-pay | Admitting: Family Medicine

## 2017-09-03 ENCOUNTER — Ambulatory Visit: Payer: BLUE CROSS/BLUE SHIELD | Admitting: Family Medicine

## 2017-09-03 VITALS — BP 116/72 | Ht 72.0 in | Wt 235.0 lb

## 2017-09-03 DIAGNOSIS — Z79899 Other long term (current) drug therapy: Secondary | ICD-10-CM

## 2017-09-03 DIAGNOSIS — R739 Hyperglycemia, unspecified: Secondary | ICD-10-CM

## 2017-09-03 DIAGNOSIS — E785 Hyperlipidemia, unspecified: Secondary | ICD-10-CM | POA: Diagnosis not present

## 2017-09-03 MED ORDER — SILDENAFIL CITRATE 100 MG PO TABS: 50.0000 mg | ORAL_TABLET | Freq: Every day | ORAL | 11 refills | Status: DC | PRN

## 2017-09-03 NOTE — Progress Notes (Signed)
   Subjective:    Patient ID: William DeistJoseph M Strickland, male    DOB: 02/27/1956, 62 y.o.   MRN: [id number]  Hyperlipidemia  This is a chronic problem. The problem is controlled. There are no known factors aggravating his hyperlipidemia. There are no compliance problems.   Pt states the date has ran out on his Sildenafil.   #1 coronary artery disease followed by specialist  Patient continues to take lipid medication regularly. No obvious side effects from it. Generally does not miss a dose. Prior blood work results are reviewed with patient. Patient continues to work on fat intake in diet  bp overall good  Would self gr diet as so so,        Review of Systems No headache, no major weight loss or weight gain, no chest pain no back pain abdominal pain no change in bowel habits complete ROS otherwise negative     Objective:   Physical Exam  Alert vitals stable, NAD. Blood pressure good on repeat. HEENT normal. Lungs clear. Heart regular rate and rhythm.      Assessment & Plan:  Impression hyperlipidemia discussed importance of compliance discussed.  Prior blood work discussed.  Will reassess blood work follow-up in 6 months for chronic plus wellness

## 2017-09-03 NOTE — Addendum Note (Signed)
Addended by: Metro KungICHARDS, WENDY M on: 09/03/2017 04:28 PM   Modules accepted: Orders

## 2017-09-04 LAB — GLUCOSE, RANDOM: GLUCOSE: 94 mg/dL (ref 65–99)

## 2017-09-04 LAB — LIPID PANEL
CHOLESTEROL TOTAL: 94 mg/dL — AB (ref 100–199)
Chol/HDL Ratio: 2.7 ratio (ref 0.0–5.0)
HDL: 35 mg/dL — AB (ref >39)
LDL Calculated: 47 mg/dL (ref 0–99)
Triglycerides: 60 mg/dL (ref 0–149)
VLDL CHOLESTEROL CAL: 12 mg/dL (ref 5–40)

## 2017-09-04 LAB — HEPATIC FUNCTION PANEL
ALK PHOS: 95 IU/L (ref 39–117)
ALT: 24 IU/L (ref 0–44)
AST: 24 IU/L (ref 0–40)
Albumin: 4.4 g/dL (ref 3.6–4.8)
BILIRUBIN, DIRECT: 0.21 mg/dL (ref 0.00–0.40)
Bilirubin Total: 0.8 mg/dL (ref 0.0–1.2)
Total Protein: 6.5 g/dL (ref 6.0–8.5)

## 2017-09-05 ENCOUNTER — Encounter: Payer: Self-pay | Admitting: Family Medicine

## 2017-10-22 ENCOUNTER — Telehealth: Payer: Self-pay | Admitting: *Deleted

## 2017-10-22 ENCOUNTER — Other Ambulatory Visit: Payer: Self-pay

## 2017-10-22 ENCOUNTER — Telehealth: Payer: Self-pay | Admitting: Family Medicine

## 2017-10-22 MED ORDER — VARDENAFIL HCL 20 MG PO TABS: ORAL_TABLET | ORAL | 0 refills | Status: DC

## 2017-10-22 MED ORDER — VARDENAFIL HCL 20 MG PO TABS: ORAL_TABLET | ORAL | 11 refills | Status: DC

## 2017-10-22 NOTE — Telephone Encounter (Signed)
vardenafil 20 mg ten one p o two hrs priior to sex [id number] ref

## 2017-10-22 NOTE — Telephone Encounter (Signed)
I sent in rx.I tried calling no answer.

## 2017-10-22 NOTE — Telephone Encounter (Signed)
Pharmacy faxed over drug change request. Sildenafil 100mg  tab is noot covered by patient plan. Preferred alternative is Tadalafil tab, Vardenafil tab. Please advise. Thank you.

## 2017-10-23 NOTE — Telephone Encounter (Signed)
error 

## 2017-11-02 ENCOUNTER — Other Ambulatory Visit: Payer: Self-pay | Admitting: Cardiovascular Disease

## 2017-11-02 NOTE — Telephone Encounter (Signed)
Rx request sent to pharmacy.  

## 2017-11-02 NOTE — Telephone Encounter (Signed)
Patient notified

## 2017-11-02 NOTE — Telephone Encounter (Signed)
I called left a message to r/c. 

## 2017-11-24 DIAGNOSIS — Z029 Encounter for administrative examinations, unspecified: Secondary | ICD-10-CM

## 2017-11-30 ENCOUNTER — Ambulatory Visit: Payer: BC Managed Care – PPO | Admitting: Family Medicine

## 2017-11-30 VITALS — BP 136/88 | Ht 72.0 in | Wt 250.0 lb

## 2017-11-30 DIAGNOSIS — S39012A Strain of muscle, fascia and tendon of lower back, initial encounter: Secondary | ICD-10-CM

## 2017-11-30 DIAGNOSIS — M545 Low back pain: Secondary | ICD-10-CM | POA: Diagnosis not present

## 2017-11-30 MED ORDER — TIZANIDINE HCL 4 MG PO TABS: 4.0000 mg | ORAL_TABLET | Freq: Four times a day (QID) | ORAL | 0 refills | Status: DC | PRN

## 2017-11-30 MED ORDER — PREDNISONE 20 MG PO TABS: ORAL_TABLET | ORAL | 0 refills | Status: DC

## 2017-11-30 NOTE — Progress Notes (Signed)
   Subjective:    Patient ID: William Strickland, male    DOB: February 13, 1956, 62 y.o.   MRN: [id number]   Back Pain   This is a new problem. Episode onset: 10 DAYS.  The pain is present in the lumbar spine. The symptoms are aggravated by bending and twisting. He has tried chiropractic manipulation (ibuprofen) for the symptoms.    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   1No fever no chills no vomiting alert active good hydration lungs clear.  Heart    Objective:   Physical Exam  Regular rate and rhythm.  Negative CVA tenderness negative lumbar tenderness.  Positive right paraspinal tenderness.  Negative sciatic notch   impression lumbar strain      Assessment & Plan:  1 impression lumbar strain local measures discussed.  Anti-inflammatory medicine prescribed.  Muscle spasm medicine prescribed expect gradual resolution

## 2018-03-03 ENCOUNTER — Encounter: Payer: BLUE CROSS/BLUE SHIELD | Admitting: Family Medicine

## 2018-03-16 ENCOUNTER — Encounter: Payer: Self-pay | Admitting: Family Medicine

## 2018-03-16 ENCOUNTER — Ambulatory Visit (INDEPENDENT_AMBULATORY_CARE_PROVIDER_SITE_OTHER): Payer: BC Managed Care – PPO | Admitting: Family Medicine

## 2018-03-16 VITALS — BP 120/78 | Ht 72.0 in | Wt 244.6 lb

## 2018-03-16 DIAGNOSIS — Z23 Encounter for immunization: Secondary | ICD-10-CM

## 2018-03-16 DIAGNOSIS — Z0000 Encounter for general adult medical examination without abnormal findings: Secondary | ICD-10-CM | POA: Diagnosis not present

## 2018-03-16 MED ORDER — VARDENAFIL HCL 20 MG PO TABS: ORAL_TABLET | ORAL | 11 refills | Status: DC

## 2018-03-16 NOTE — Patient Instructions (Signed)
DR Dallie Pileseogh is the ENT    + + + ++ +

## 2018-03-16 NOTE — Progress Notes (Signed)
   Subjective:    Patient ID: William Strickland, male    DOB: [birth year], 62 y.o.   MRN: [id number]  HPI The patient comes in today for a wellness visit.    A review of their health history was completed.  A review of medications was also completed.  Any needed refills; Vardenafil  Eating habits: good  Falls/  MVA accidents in past few months: none  Regular exercise: farm work daily  Specialist pt sees on regular basis: cardiologist  Preventative health issues were discussed.   Additional concerns: none  Staying busy with thoing s   Stay s active aroond the clock with work   erec   meds working alright      Review of Systems  Constitutional: Negative for activity change, appetite change and fever.  HENT: Negative for congestion and rhinorrhea.   Eyes: Negative for discharge.  Respiratory: Negative for cough and wheezing.   Cardiovascular: Negative for chest pain.  Gastrointestinal: Negative for abdominal pain, blood in stool and vomiting.  Genitourinary: Negative for difficulty urinating and frequency.  Musculoskeletal: Negative for neck pain.  Skin: Negative for rash.  Allergic/Immunologic: Negative for environmental allergies and food allergies.  Neurological: Negative for weakness and headaches.  Psychiatric/Behavioral: Negative for agitation.  All other systems reviewed and are negative.      Objective:   Physical Exam  Constitutional: He appears well-developed and well-nourished.  HENT:  Head: Normocephalic and atraumatic.  Right Ear: External ear normal.  Left Ear: External ear normal.  Nose: Nose normal.  Mouth/Throat: Oropharynx is clear and moist.  Eyes: Pupils are equal, round, and reactive to light. EOM are normal.  Neck: Normal range of motion. Neck supple. No thyromegaly present.  Cardiovascular: Normal rate, regular rhythm and normal heart sounds.  No murmur heard. Pulmonary/Chest: Effort normal and breath sounds normal. No respiratory  distress. He has no wheezes.  Abdominal: Soft. Bowel sounds are normal. He exhibits no distension and no mass. There is no tenderness.  Genitourinary: Penis normal.  Musculoskeletal: Normal range of motion. He exhibits no edema.  Lymphadenopathy:    He has no cervical adenopathy.  Neurological: He is alert. He exhibits normal muscle tone.  Skin: Skin is warm and dry. No erythema.  Psychiatric: He has a normal mood and affect. His behavior is normal. Judgment normal.  Vitals reviewed.         Assessment & Plan:  Impression wellness exam.  Vaccines discussed.  Tdap recommended.  Diet discussed.  Exercise discussed.  Followed by cardiologist for coronary artery disease.  Colon done 2018, next due in ten yrs/erectile dysfunction medication refilled  Recommend follow-up with Dr. Jodean Limaio for cerumen impaction.  Name given

## 2018-05-12 ENCOUNTER — Encounter: Payer: Self-pay | Admitting: Family Medicine

## 2018-05-12 ENCOUNTER — Ambulatory Visit: Payer: BC Managed Care – PPO | Admitting: Family Medicine

## 2018-05-12 VITALS — BP 130/90 | Temp 98.0°F | Ht 72.0 in | Wt 247.2 lb

## 2018-05-12 DIAGNOSIS — M545 Low back pain: Secondary | ICD-10-CM | POA: Diagnosis not present

## 2018-05-12 DIAGNOSIS — Z23 Encounter for immunization: Secondary | ICD-10-CM | POA: Diagnosis not present

## 2018-05-12 DIAGNOSIS — S39012A Strain of muscle, fascia and tendon of lower back, initial encounter: Secondary | ICD-10-CM

## 2018-05-12 MED ORDER — SILDENAFIL CITRATE 20 MG PO TABS: ORAL_TABLET | ORAL | 6 refills | Status: DC

## 2018-05-12 MED ORDER — PREDNISONE 20 MG PO TABS: ORAL_TABLET | ORAL | 0 refills | Status: DC

## 2018-05-12 NOTE — Progress Notes (Signed)
   Subjective:    Patient ID: William Strickland, male    DOB: 06-07-1956, 62 y.o.   MRN: [id number]  Back Pain  This is a new problem. The current episode started in the past 7 days. The pain does not radiate. 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   Pt would like Levitra changed to Viagra. Pt states pharmacist said that she could get patient a cheaper co pay on generic Viagra.   Catch Sunday morning '  Right lumbar side  No pain down to thleg   Steroid taper in the past hekped a lot   viagt a want to try  Review of Systems  Musculoskeletal: Positive for back pain.       Objective:   Physical Exam  Alert vitals stable, NAD. Blood pressure good on repeat. HEENT normal. Lungs clear. Heart regular rate and rhythm. Diffuse tenderness right lower lumbar region.  Negative straight leg raise.      Assessment & Plan:  Impression lumbar strain plan prednisone taper local measures discussed also would like to try some different erectile dysfunction is costly.  Stop Levitra.  Start as needed generic sildenafil/flu shot

## 2018-06-16 ENCOUNTER — Other Ambulatory Visit: Payer: Self-pay | Admitting: Cardiovascular Disease

## 2018-07-14 NOTE — Telephone Encounter (Signed)
error 

## 2018-09-01 ENCOUNTER — Encounter: Payer: Self-pay | Admitting: Cardiovascular Disease

## 2018-09-01 ENCOUNTER — Ambulatory Visit: Payer: BC Managed Care – PPO | Admitting: Cardiovascular Disease

## 2018-09-01 VITALS — BP 126/70 | HR 56 | Ht 72.0 in | Wt 252.8 lb

## 2018-09-01 DIAGNOSIS — I2510 Atherosclerotic heart disease of native coronary artery without angina pectoris: Secondary | ICD-10-CM

## 2018-09-01 DIAGNOSIS — E7800 Pure hypercholesterolemia, unspecified: Secondary | ICD-10-CM

## 2018-09-01 LAB — LIPID PANEL
CHOL/HDL RATIO: 3.6 ratio (ref 0.0–5.0)
Cholesterol, Total: 121 mg/dL (ref 100–199)
HDL: 34 mg/dL — ABNORMAL LOW (ref >39)
LDL CALC: 70 mg/dL (ref 0–99)
Triglycerides: 83 mg/dL (ref 0–149)
VLDL Cholesterol Cal: 17 mg/dL (ref 5–40)

## 2018-09-01 LAB — HEPATIC FUNCTION PANEL
ALBUMIN: 4.5 g/dL (ref 3.8–4.8)
ALT: 27 IU/L (ref 0–44)
AST: 29 IU/L (ref 0–40)
Alkaline Phosphatase: 99 IU/L (ref 39–117)
BILIRUBIN TOTAL: 0.7 mg/dL (ref 0.0–1.2)
BILIRUBIN, DIRECT: 0.21 mg/dL (ref 0.00–0.40)
TOTAL PROTEIN: 6.6 g/dL (ref 6.0–8.5)

## 2018-09-01 NOTE — Assessment & Plan Note (Signed)
History of hyperlipidemia on statin therapy with lipid profile performed 09/03/2017 revealing total cholesterol 94, LDL 47 and HDL of 35.

## 2018-09-01 NOTE — Assessment & Plan Note (Signed)
History of CAD status post coronary artery bypass grafting in 2004 with a LIMA to his LAD, RIMA to an obtuse marginal branch, vein to diagonal grand branch and sequential vein to the PDA/PLA.  He had a nonischemic Myoview 06/04/2012.  He denies chest pain or shortness of breath.

## 2018-09-01 NOTE — Progress Notes (Signed)
09/01/2018 LEANNE BUCKERT   1956-06-29  [id number]  Primary Physician Gerda Diss Vilinda Blanks, MD Primary Cardiologist: [name] MD FACP, [place], [place], [place]  HPI:  William Strickland is a 63 y.o.  mildly overweight widowed Caucasian male father of 3 children, grandfather to 6 grandchildren who I last saw in the office  07/14/2017. He has a history of CAD status post coronary artery bypass grafting in 2004 with an LIMA graft to the LAD, RIMA graft to an OM branch, a vein to diagonal branch, and a sequential vein to the PDA/PL A. His other problems include hyperlipidemia. His recent lipid profile performed 09/03/2017 revealed an LDL of  47 and an HDL of 35. He had a nonischemic Myoview 06/04/12. He denies chest pain or shortness of breath.   Current Meds  Medication Sig  . aspirin 81 MG tablet Take 81 mg by mouth daily.  Marland Kitchen atorvastatin (LIPITOR) 80 MG tablet take 1 tablet by mouth once daily  . metoprolol tartrate (LOPRESSOR) 50 MG tablet take 1/2 tablet by mouth twice a day  . niacin (NIASPAN) 1000 MG CR tablet TAKE 1 TABLET BY MOUTH AT BEDTIME  . nitroGLYCERIN (NITROSTAT) 0.4 MG SL tablet Place 1 tablet (0.4 mg total) under the tongue every 5 (five) minutes as needed for chest pain.  . sildenafil (REVATIO) 20 MG tablet Take 3-5 tablets by mouth as needed     No Known Allergies  Social History   Socioeconomic History  . Marital status: Widowed    Spouse name: Not on file  . Number of children: 3  . Years of education: Not on file  . Highest education level: Not on file  Occupational History  . Not on file  Social Needs  . Financial resource strain: Not on file  . Food insecurity:    Worry: Not on file    Inability: Not on file  . Transportation needs:    Medical: Not on file    Non-medical: Not on file  Tobacco Use  . Smoking status: Never Smoker  . Smokeless tobacco: Never Used  Substance and Sexual Activity  . Alcohol use: No  . Drug use: No  . Sexual activity:  Yes    Partners: Male  Lifestyle  . Physical activity:    Days per week: Not on file    Minutes per session: Not on file  . Stress: Not on file  Relationships  . Social connections:    Talks on phone: Not on file    Gets together: Not on file    Attends religious service: Not on file    Active member of club or organization: Not on file    Attends meetings of clubs or organizations: Not on file    Relationship status: Not on file  . Intimate partner violence:    Fear of current or ex partner: Not on file    Emotionally abused: Not on file    Physically abused: Not on file    Forced sexual activity: Not on file  Other Topics Concern  . Not on file  Social History Narrative  . Not on file     Review of Systems: General: negative for chills, fever, night sweats or weight changes.  Cardiovascular: negative for chest pain, dyspnea on exertion, edema, orthopnea, palpitations, paroxysmal nocturnal dyspnea or shortness of breath Dermatological: negative for rash Respiratory: negative for cough or wheezing Urologic: negative for hematuria Abdominal: negative for nausea, vomiting, diarrhea, bright red blood  per rectum, melena, or hematemesis Neurologic: negative for visual changes, syncope, or dizziness All other systems reviewed and are otherwise negative except as noted above.    Blood pressure 126/70, pulse (!) 56, height 6' (1.829 m), weight 252 lb 12.8 oz (114.7 kg).  General appearance: alert and no distress Neck: no adenopathy, no carotid bruit, no JVD, supple, symmetrical, trachea midline and thyroid not enlarged, symmetric, no tenderness/mass/nodules Lungs: clear to auscultation bilaterally Heart: regular rate and rhythm, S1, S2 normal, no murmur, click, rub or gallop Extremities: extremities normal, atraumatic, no cyanosis or edema Pulses: 2+ and symmetric Skin: Skin color, texture, turgor normal. No rashes or lesions Neurologic: Alert and oriented X 3, normal strength  and tone. Normal symmetric reflexes. Normal coordination and gait  EKG sinus bradycardia 56 without ST or T wave changes.  I personally reviewed this EKG.  ASSESSMENT AND PLAN:   Coronary artery disease, 2004 CABG, cath 07/08/13 with patent grafts History of CAD status post coronary artery bypass grafting in 2004 with a LIMA to his LAD, RIMA to an obtuse marginal branch, vein to diagonal grand branch and sequential vein to the PDA/PLA.  He had a nonischemic Myoview 06/04/2012.  He denies chest pain or shortness of breath.  Hyperlipidemia History of hyperlipidemia on statin therapy with lipid profile performed 09/03/2017 revealing total cholesterol 94, LDL 47 and HDL of 35.      [name] MD FACP,FACC,FAHA, [facility] 09/01/2018 9:20 AM

## 2018-09-01 NOTE — Patient Instructions (Signed)
Medication Instructions:  NONE If you need a refill on your cardiac medications before your next appointment, please call your pharmacy.   Lab work: Your physician recommends that you return for lab work TODAY: LIPID/LIVER  If you have labs (blood work) drawn today and your tests are completely normal, you will receive your results only by: Marland Kitchen MyChart Message (if you have MyChart) OR . A paper copy in the mail If you have any lab test that is abnormal or we need to change your treatment, we will call you to review the results.  Testing/Procedures: NONE  Follow-Up: At [facility], you and your health needs are our priority.  As part of our continuing mission to provide you with exceptional heart care, we have created designated Provider Care Teams.  These Care Teams include your primary Cardiologist (physician) and Advanced Practice Providers (APPs -  Physician Assistants and Nurse Practitioners) who all work together to provide you with the care you need, when you need it. . You will need a follow up appointment in 12 months.  Please call our office 2 months in advance to schedule this appointment.  You may see Dr. Allyson Sabal or one of the following Advanced Practice Providers on your designated Care Team:   . Corine Shelter, [place] . Azalee Course, PA-C . Micah Flesher, PA-C . Joni Reining, DNP . Theodore Demark, PA-C . Judy Pimple, PA-C . Marjie Skiff, PA-C

## 2018-09-03 ENCOUNTER — Encounter: Payer: Self-pay | Admitting: *Deleted

## 2018-09-14 ENCOUNTER — Other Ambulatory Visit: Payer: Self-pay | Admitting: Cardiovascular Disease

## 2018-09-14 NOTE — Telephone Encounter (Signed)
Rx request sent to pharmacy.  

## 2018-09-21 ENCOUNTER — Other Ambulatory Visit: Payer: Self-pay | Admitting: Cardiovascular Disease

## 2019-05-28 ENCOUNTER — Other Ambulatory Visit: Payer: Self-pay | Admitting: Family Medicine

## 2019-08-02 ENCOUNTER — Other Ambulatory Visit: Payer: Self-pay

## 2019-08-02 ENCOUNTER — Ambulatory Visit (INDEPENDENT_AMBULATORY_CARE_PROVIDER_SITE_OTHER): Payer: BC Managed Care – PPO | Admitting: Family Medicine

## 2019-08-02 DIAGNOSIS — L209 Atopic dermatitis, unspecified: Secondary | ICD-10-CM

## 2019-08-02 MED ORDER — MOMETASONE FUROATE 0.1 % EX CREA: TOPICAL_CREAM | CUTANEOUS | 1 refills | Status: AC

## 2019-08-02 NOTE — Progress Notes (Signed)
   Subjective:    Patient ID: William Strickland, male    DOB: 10-Nov-1955, 64 y.o.   MRN: [id number]  HPI  Patient calls with rash on left leg for a month. Patient states the rash looks really dry and red and he has been working with it a month and it not went away-lotion does help some. Patient denies any fever denies any pus draining from it.  Denies any sweats or chills.  States he has tried lotion has not tried anything else on it.  States it does itch.  It is not tender to the touch. Patient had a difficult time with the video phone but as best as he could he showed me the area Virtual Visit via Video Note  I connected with Samara Deist on 08/02/19 at  4:10 PM EST by a video enabled telemedicine application and verified that I am speaking with the correct person using two identifiers.  Location: Patient: home Provider: office   I discussed the limitations of evaluation and management by telemedicine and the availability of in person appointments. The patient expressed understanding and agreed to proceed.  History of Present Illness:    Observations/Objective:   Assessment and Plan:   Follow Up Instructions:    I discussed the assessment and treatment plan with the patient. The patient was provided an opportunity to ask questions and all were answered. The patient agreed with the plan and demonstrated an understanding of the instructions.   The patient was advised to call back or seek an in-person evaluation if the symptoms worsen or if the condition fails to improve as anticipated.  I provided 10 minutes of non-face-to-face time during this encounter.     Review of Systems Patient denies fever chills sweats denies fluctuance denies drainage denies tenderness or pain he does relate itching and dryness    Objective:   Physical Exam  Patient has 2 patches 1 on the left leg 1 on the right leg.  Does not appear to be infected by video noncompliance.  Nontender to the  touch.  No drainage or fluctuance does appear red.      Assessment & Plan:  Atopic dermatitis Lotion on a regular basis Steroid cream twice daily over the next 2 weeks If this is not cleared up he needs to come in for a face-to-face evaluation to look at it in person No sign of abscess or cellulitis seen on video

## 2019-08-11 ENCOUNTER — Other Ambulatory Visit: Payer: Self-pay

## 2019-08-11 ENCOUNTER — Ambulatory Visit: Payer: BC Managed Care – PPO | Attending: Internal Medicine

## 2019-08-11 DIAGNOSIS — Z20822 Contact with and (suspected) exposure to covid-19: Secondary | ICD-10-CM

## 2019-08-12 LAB — NOVEL CORONAVIRUS, NAA: SARS-CoV-2, NAA: NOT DETECTED

## 2019-09-01 ENCOUNTER — Encounter: Payer: Self-pay | Admitting: Family Medicine

## 2019-09-09 ENCOUNTER — Other Ambulatory Visit: Payer: Self-pay | Admitting: Cardiovascular Disease

## 2019-09-29 ENCOUNTER — Ambulatory Visit: Payer: Self-pay | Attending: Internal Medicine

## 2019-10-14 ENCOUNTER — Ambulatory Visit: Payer: Self-pay | Attending: Internal Medicine

## 2019-10-14 DIAGNOSIS — Z23 Encounter for immunization: Secondary | ICD-10-CM

## 2019-10-14 NOTE — Progress Notes (Signed)
   Covid-19 Vaccination Clinic  Name:  William Strickland    MRN: [id number] DOB: [birth year]  10/14/2019  William Strickland was observed post Covid-19 immunization for 15 minutes without incident. He was provided with Vaccine Information Sheet and instruction to access the V-Safe system.   William Strickland was instructed to call 911 with any severe reactions post vaccine: Marland Kitchen Difficulty breathing  . Swelling of face and throat  . A fast heartbeat  . A bad rash all over body  . Dizziness and weakness   Immunizations Administered    Name Date Dose VIS Date Route   Moderna COVID-19 Vaccine 10/14/2019  8:40 AM 0.5 mL 06/28/2019 Intramuscular   Manufacturer: Moderna   Lot: [id number]   NDC: 23017-209-10

## 2019-10-18 ENCOUNTER — Other Ambulatory Visit: Payer: Self-pay

## 2019-10-18 ENCOUNTER — Encounter: Payer: Self-pay | Admitting: Family Medicine

## 2019-10-18 ENCOUNTER — Ambulatory Visit (INDEPENDENT_AMBULATORY_CARE_PROVIDER_SITE_OTHER): Payer: Medicare Other | Admitting: Family Medicine

## 2019-10-18 VITALS — BP 128/80 | Temp 96.4°F | Ht 72.0 in | Wt 255.2 lb

## 2019-10-18 DIAGNOSIS — E7800 Pure hypercholesterolemia, unspecified: Secondary | ICD-10-CM

## 2019-10-18 DIAGNOSIS — Z125 Encounter for screening for malignant neoplasm of prostate: Secondary | ICD-10-CM | POA: Diagnosis not present

## 2019-10-18 DIAGNOSIS — Z79899 Other long term (current) drug therapy: Secondary | ICD-10-CM | POA: Diagnosis not present

## 2019-10-18 DIAGNOSIS — Z23 Encounter for immunization: Secondary | ICD-10-CM

## 2019-10-18 DIAGNOSIS — Z0000 Encounter for general adult medical examination without abnormal findings: Secondary | ICD-10-CM

## 2019-10-18 MED ORDER — TRIAMCINOLONE ACETONIDE 0.1 % EX OINT: TOPICAL_OINTMENT | CUTANEOUS | 0 refills | Status: DC

## 2019-10-18 MED ORDER — SILDENAFIL CITRATE 20 MG PO TABS: ORAL_TABLET | ORAL | 11 refills | Status: DC

## 2019-10-18 NOTE — Progress Notes (Signed)
Subjective:    Patient ID: William Strickland, male    DOB: 1955-09-13, 64 y.o.   MRN: [id number]  HPI AWV- Annual Wellness Visit  The patient was seen for their annual wellness visit. The patient's past medical history, surgical history, and family history were reviewed. Pertinent vaccines were reviewed ( tetanus, pneumonia, shingles, flu) The patient's medication list was reviewed and updated.  The height and weight were entered.  BMI recorded in electronic record elsewhere  Cognitive screening was completed. Outcome of Mini - Cog: Pass   Falls /depression screening electronically recorded within record elsewhere  Current tobacco usage:none (All patients who use tobacco were given written and verbal information on quitting)  Recent listing of emergency department/hospitalizations over the past year were reviewed.  current specialist the patient sees on a regular basis: Dr. Allyson Sabal   Medicare annual wellness visit patient questionnaire was reviewed.  A written screening schedule for the patient for the next 5-10 years was given. Appropriate discussion of followup regarding next visit was discussed.  Results for orders placed or performed in visit on 08/11/19  Novel Coronavirus, NAA ([facility])   Specimen: Nasopharyngeal(NP) swabs in vial transport medium   NASOPHARYNGE  TESTING  Result Value Ref Range   SARS-CoV-2, NAA Not Detected Not Detected    Due t  [place] soon  Last colon done in 2018, next due in ten yrs  Staying physically active   Knees  Get to aching    Needs fasting b w       Review of Systems  Constitutional: Negative for activity change, appetite change and fever.  HENT: Negative for congestion and rhinorrhea.   Eyes: Negative for discharge.  Respiratory: Negative for cough and wheezing.   Cardiovascular: Negative for chest pain.  Gastrointestinal: Negative for abdominal pain, blood in stool and vomiting.  Genitourinary: Negative for  difficulty urinating and frequency.  Musculoskeletal: Negative for neck pain.  Skin: Negative for rash.  Allergic/Immunologic: Negative for environmental allergies and food allergies.  Neurological: Negative for weakness and headaches.  Psychiatric/Behavioral: Negative for agitation.  All other systems reviewed and are negative.      Objective:   Physical Exam Vitals reviewed.  Constitutional:      Appearance: He is well-developed.  HENT:     Head: Normocephalic and atraumatic.     Right Ear: External ear normal.     Left Ear: External ear normal.     Nose: Nose normal.  Eyes:     Pupils: Pupils are equal, round, and reactive to light.  Neck:     Thyroid: No thyromegaly.  Cardiovascular:     Rate and Rhythm: Normal rate and regular rhythm.     Heart sounds: Normal heart sounds. No murmur.  Pulmonary:     Effort: Pulmonary effort is normal. No respiratory distress.     Breath sounds: Normal breath sounds. No wheezing.  Abdominal:     General: Bowel sounds are normal. There is no distension.     Palpations: Abdomen is soft. There is no mass.     Tenderness: There is no abdominal tenderness.  Genitourinary:    Penis: Normal.   Musculoskeletal:        General: Normal range of motion.     Cervical back: Normal range of motion and neck supple.  Lymphadenopathy:     Cervical: No cervical adenopathy.  Skin:    General: Skin is warm and dry.     Findings: No erythema.  Neurological:  Mental Status: He is alert.     Motor: No abnormal muscle tone.  Psychiatric:        Behavior: Behavior normal.        Judgment: Judgment normal.    Legs.  Bilateral venous stasis changes evident.  Arterial pulses good.  Patch of venous stasis eczema lateral calf noted       Assessment & Plan:  Impression 1 wellness exam.  Has already had Covid vaccine.  Diet discussed.  Exercise discussed.  Patient needs Pneumovax.  Recommended.  Up-to-date on colonoscopies.  2.  Coronary artery  disease.  Followed by the cardiologist.  Has not seen him for 1 year due to schedule appointment soon.  3.  Venous stasis eczema.  Discussed.  Triamcinolone ointment twice daily to affected area  Appropriate blood work.  Further recommendations based on results.  Chronic medications refilled

## 2019-11-15 ENCOUNTER — Ambulatory Visit: Payer: Self-pay | Attending: Internal Medicine

## 2019-11-15 DIAGNOSIS — Z23 Encounter for immunization: Secondary | ICD-10-CM

## 2019-11-15 NOTE — Progress Notes (Signed)
   Covid-19 Vaccination Clinic  Name:  William Strickland    MRN: [id number] DOB: Nov 26, 1955  11/15/2019  Mr. William Strickland was observed post Covid-19 immunization for 15 minutes without incident. He was provided with Vaccine Information Sheet and instruction to access the V-Safe system.   Mr. William Strickland was instructed to call 911 with any severe reactions post vaccine: Marland Kitchen Difficulty breathing  . Swelling of face and throat  . A fast heartbeat  . A bad rash all over body  . Dizziness and weakness   Immunizations Administered    Name Date Dose VIS Date Route   Moderna COVID-19 Vaccine 11/15/2019  8:18 AM 0.5 mL 06/2019 Intramuscular   Manufacturer: Moderna   Lot: [id number]   NDC: 69978-020-89

## 2019-11-25 LAB — HEPATIC FUNCTION PANEL
ALT: 20 IU/L (ref 0–44)
AST: 21 IU/L (ref 0–40)
Albumin: 4.4 g/dL (ref 3.8–4.8)
Alkaline Phosphatase: 103 IU/L (ref 39–117)
Bilirubin Total: 0.7 mg/dL (ref 0.0–1.2)
Bilirubin, Direct: 0.21 mg/dL (ref 0.00–0.40)
Total Protein: 6.6 g/dL (ref 6.0–8.5)

## 2019-11-25 LAB — PSA: Prostate Specific Ag, Serum: 1.3 ng/mL (ref 0.0–4.0)

## 2019-11-25 LAB — BASIC METABOLIC PANEL
BUN/Creatinine Ratio: 14 (ref 10–24)
BUN: 13 mg/dL (ref 8–27)
CO2: 22 mmol/L (ref 20–29)
Calcium: 9.1 mg/dL (ref 8.6–10.2)
Chloride: 105 mmol/L (ref 96–106)
Creatinine, Ser: 0.95 mg/dL (ref 0.76–1.27)
GFR calc Af Amer: 97 mL/min/{1.73_m2} (ref >59)
GFR calc non Af Amer: 84 mL/min/{1.73_m2} (ref >59)
Glucose: 92 mg/dL (ref 65–99)
Potassium: 4.2 mmol/L (ref 3.5–5.2)
Sodium: 140 mmol/L (ref 134–144)

## 2019-11-25 LAB — LIPID PANEL
Chol/HDL Ratio: 3.3 ratio (ref 0.0–5.0)
Cholesterol, Total: 103 mg/dL (ref 100–199)
HDL: 31 mg/dL — ABNORMAL LOW (ref >39)
LDL Chol Calc (NIH): 53 mg/dL (ref 0–99)
Triglycerides: 96 mg/dL (ref 0–149)
VLDL Cholesterol Cal: 19 mg/dL (ref 5–40)

## 2019-11-27 ENCOUNTER — Encounter: Payer: Self-pay | Admitting: Family Medicine

## 2019-11-29 ENCOUNTER — Ambulatory Visit: Payer: Medicare Other | Admitting: Cardiovascular Disease

## 2019-11-29 ENCOUNTER — Encounter: Payer: Self-pay | Admitting: Cardiovascular Disease

## 2019-11-29 ENCOUNTER — Other Ambulatory Visit: Payer: Self-pay

## 2019-11-29 VITALS — BP 104/78 | HR 61 | Ht 72.0 in | Wt 251.6 lb

## 2019-11-29 DIAGNOSIS — E782 Mixed hyperlipidemia: Secondary | ICD-10-CM | POA: Diagnosis not present

## 2019-11-29 DIAGNOSIS — I2510 Atherosclerotic heart disease of native coronary artery without angina pectoris: Secondary | ICD-10-CM | POA: Diagnosis not present

## 2019-11-29 NOTE — Patient Instructions (Signed)

## 2019-11-29 NOTE — Assessment & Plan Note (Signed)
History of CAD status post coronary artery bypass grafting in 2004 with a LIMA to the LAD, RIMA to the OM branch, vein to diagonal branch and sequential vein to the PDA and PLA.  He did have a nonischemic Myoview 06/04/2012.  He is very active and denies chest pain or shortness of breath.

## 2019-11-29 NOTE — Assessment & Plan Note (Signed)
History of hyperlipidemia on statin therapy with lipid profile performed 11/24/2019 revealing a total cholesterol 103, LDL 53 and HDL of 31.

## 2019-11-29 NOTE — Progress Notes (Signed)
11/29/2019 William Strickland   06/22/1956  [id number]  Primary Physician Wolfgang Phoenix Grace Bushy, MD Primary Cardiologist: Lorretta Harp MD FACP, [place], [place], [place]  HPI:  William Strickland is a 64 y.o.   mildly overweight widowed Caucasian male father of 3 children, grandfather to 6 grandchildren who I last saw in the office  09/01/2018. He has a history of CAD status post coronary artery bypass grafting in 2004 with an LIMA graft to the LAD, RIMA graft to an OM branch, a vein to diagonal branch, and a sequential vein to the PDA/PL A. His other problems include hyperlipidemia. His recent lipid profile performed 2/7/2019revealed an LDL of 47 and an HDL of 35. He had a nonischemic Myoview 06/04/12.   Since I saw him a year ago he continues to do well.  He said no issues with COVID-19.  Very active.  Denies chest pain or shortness of breath.  Recent lipid profile performed 11/24/2019 revealed a total cholesterol 103, LDL 53 and HDL 31.  Current Meds  Medication Sig  . aspirin 81 MG tablet Take 81 mg by mouth daily.  Marland Kitchen atorvastatin (LIPITOR) 80 MG tablet Take 1 tablet (80 mg total) by mouth daily. Please schedule annual appt with Dr. Gwenlyn Found for refills. [phone]. 1st attempt.  . metoprolol tartrate (LOPRESSOR) 50 MG tablet Take 0.5 tablets (25 mg total) by mouth 2 (two) times daily. Please schedule annual appt with Dr. Gwenlyn Found for refills. [phone]. 1st attempt.  . mometasone (ELOCON) 0.1 % cream Apply to affected area twice daily as needed if not better within 2 weeks see MD  . niacin (NIASPAN) 1000 MG CR tablet Take 1 tablet (1,000 mg total) by mouth at bedtime. Please schedule annual appt with Dr. Gwenlyn Found for refills. [phone]. 1st attempt.  . nitroGLYCERIN (NITROSTAT) 0.4 MG SL tablet PLACE ONE TABLET UNDER THE TONGUE IF NEEDED EVERY 5 MINUTES FOR CHEST PAIN  . sildenafil (REVATIO) 20 MG tablet TAKE 3 TO 5 TABLETS BY MOUTH AS NEEDED  . triamcinolone ointment (KENALOG) 0.1 % Apply BID  to rash     No Known Allergies  Social History   Socioeconomic History  . Marital status: Widowed    Spouse name: Not on file  . Number of children: 3  . Years of education: Not on file  . Highest education level: Not on file  Occupational History  . Not on file  Tobacco Use  . Smoking status: Never Smoker  . Smokeless tobacco: Never Used  Substance and Sexual Activity  . Alcohol use: No  . Drug use: No  . Sexual activity: Yes    Partners: Male  Other Topics Concern  . Not on file  Social History Narrative  . Not on file   Social Determinants of Health   Financial Resource Strain:   . Difficulty of Paying Living Expenses:   Food Insecurity:   . Worried About Charity fundraiser in the Last Year:   . Arboriculturist in the Last Year:   Transportation Needs:   . Film/video editor (Medical):   Marland Kitchen Lack of Transportation (Non-Medical):   Physical Activity:   . Days of Exercise per Week:   . Minutes of Exercise per Session:   Stress:   . Feeling of Stress :   Social Connections:   . Frequency of Communication with Friends and Family:   . Frequency of Social Gatherings with Friends and Family:   . Attends Religious Services:   .  Active Member of Clubs or Organizations:   . Attends Banker Meetings:   Marland Kitchen Marital Status:   Intimate Partner Violence:   . Fear of Current or Ex-Partner:   . Emotionally Abused:   Marland Kitchen Physically Abused:   . Sexually Abused:      Review of Systems: General: negative for chills, fever, night sweats or weight changes.  Cardiovascular: negative for chest pain, dyspnea on exertion, edema, orthopnea, palpitations, paroxysmal nocturnal dyspnea or shortness of breath Dermatological: negative for rash Respiratory: negative for cough or wheezing Urologic: negative for hematuria Abdominal: negative for nausea, vomiting, diarrhea, bright red blood per rectum, melena, or hematemesis Neurologic: negative for visual changes, syncope,  or dizziness All other systems reviewed and are otherwise negative except as noted above.    Blood pressure 104/78, pulse 61, height 6' (1.829 m), weight 251 lb 9.6 oz (114.1 kg), SpO2 97 %.  General appearance: alert and no distress Neck: no adenopathy, no carotid bruit, no JVD, supple, symmetrical, trachea midline and thyroid not enlarged, symmetric, no tenderness/mass/nodules Lungs: clear to auscultation bilaterally Heart: regular rate and rhythm, S1, S2 normal, no murmur, click, rub or gallop Extremities: extremities normal, atraumatic, no cyanosis or edema Pulses: 2+ and symmetric Skin: Skin color, texture, turgor normal. No rashes or lesions Neurologic: Alert and oriented X 3, normal strength and tone. Normal symmetric reflexes. Normal coordination and gait  EKG sinus rhythm at 61 without ST or T wave changes.  I personally reviewed this EKG.  ASSESSMENT AND PLAN:   Coronary artery disease, 2004 CABG, cath 07/08/13 with patent grafts History of CAD status post coronary artery bypass grafting in 2004 with a LIMA to the LAD, RIMA to the OM branch, vein to diagonal branch and sequential vein to the PDA and PLA.  He did have a nonischemic Myoview 06/04/2012.  He is very active and denies chest pain or shortness of breath.  Hyperlipidemia History of hyperlipidemia on statin therapy with lipid profile performed 11/24/2019 revealing a total cholesterol 103, LDL 53 and HDL of 31.      [name] MD FACP,FACC,FAHA, [facility] 11/29/2019 8:29 AM

## 2019-12-20 ENCOUNTER — Other Ambulatory Visit: Payer: Medicare Other

## 2019-12-21 ENCOUNTER — Telehealth: Payer: Self-pay | Admitting: Cardiovascular Disease

## 2019-12-21 NOTE — Telephone Encounter (Signed)
Follow up    Pt is following up   Please call when medication is sent to pharmacy

## 2019-12-21 NOTE — Telephone Encounter (Signed)
New message   Patient needs a new prescription for niacin (NIASPAN) 1000 MG CR tablet   atorvastatin (LIPITOR) 80 MG tablet   metoprolol tartrate (LOPRESSOR) 50 MG tablet sent to [facility] [phone] - [place], [place] - [address] AT [facility] [address]

## 2019-12-22 MED ORDER — NIACIN ER (ANTIHYPERLIPIDEMIC) 1000 MG PO TBCR: 1000.0000 mg | EXTENDED_RELEASE_TABLET | Freq: Every day | ORAL | 3 refills | Status: DC

## 2019-12-22 MED ORDER — METOPROLOL TARTRATE 50 MG PO TABS: 25.0000 mg | ORAL_TABLET | Freq: Two times a day (BID) | ORAL | 3 refills | Status: DC

## 2019-12-22 MED ORDER — ATORVASTATIN CALCIUM 80 MG PO TABS: 80.0000 mg | ORAL_TABLET | Freq: Every day | ORAL | 3 refills | Status: DC

## 2019-12-22 NOTE — Telephone Encounter (Signed)
Patient  Calling to have prescription refilled.   The 3  Medication   Atorvastatin  Niacin CR Metoprolol  RN e-sent medication - 90 day supply  3 refills . Last ov was 12/04/19 with Dr Allyson Sabal   patient verbalized understanding

## 2020-08-21 ENCOUNTER — Telehealth: Payer: Self-pay | Admitting: Cardiovascular Disease

## 2020-08-21 MED ORDER — NITROGLYCERIN 0.4 MG SL SUBL: SUBLINGUAL_TABLET | SUBLINGUAL | 3 refills | Status: DC

## 2020-08-21 NOTE — Telephone Encounter (Signed)
Spoke with patient of Dr. Allyson Sabal (h/o CABG) who reports palpitations, heart beating hard last night. He has no h/o irregular heart beat. He has no way to check BP/pulse except manually. Scheduled him appointment with Samara Deist NP on 1/28 @ 0915. Refilled NTG per request, which he has not used recently but said he may have used last night. Advised to seek ED eval for chest pain/pressure/heaviness lasting >30 minutes and to notify us if he has recurrent/worsening heart palps/related symptoms

## 2020-08-21 NOTE — Telephone Encounter (Signed)
Patient c/o Palpitations:  High priority if patient c/o lightheadedness, shortness of breath, or chest pain  How long have you had palpitations/irregular HR/ Afib? Are you having the symptoms now? Heart racing and beating hard- couple of hours last night- no symptoms at this time 1) Are you currently experiencing lightheadedness, SOB or CP? no  2) Do you have a history of afib (atrial fibrillation) or irregular heart rhythm?   3) Have you checked your BP or HR? (document readings if available):  Do not have a blood pressure cuff  4) Are you experiencing any other symptoms? no

## 2020-08-23 NOTE — Progress Notes (Signed)
Cardiology Office Note   Date:  08/24/2020   ID:  William Strickland, William Strickland 1955-09-27, MRN [id number]  PCP:  [name], MD (Inactive)  Cardiologist: [name] MD [name], [place], [place]  No chief complaint on file.    History of Present Illness: William Strickland is a 65 y.o. male who presents for ongoing assessment and management of coronary artery disease, with history of CAD status post CABG in 2004 with LIMA to LAD, RIMA graft to OM branch, SVG to diagonal branch, and a sequential vein to PDA/PLA.  Other history includes hyperlipidemia, food insecurity, and lack of transportation.  He had a follow-up cardiac catheterization in 2014 with patent grafts.  He had a nonischemic Myoview in 2013.  He was last seen by Dr. Allyson Sabal on 11/29/2019 with no medication changes or follow-up testing planned.  [name] called our office on 08/21/2020 with complaints of palpitation, feeling his heart beating hard, and spoke with [name], RN.  He had no way to check his blood pressure pulse.  As result of the symptoms he was advised to have an appointment with me today.  Nitroglycerin was refilled.  He was advised to see medical attention in the ED for chest pain or recurrent palpitations that were significantly severe.  He is here today with his wife with 3 main complaints.  Approximately 2 weeks ago the patient had a fever of greater than 100 for 3 days, with sinus congestion and cough.  He had nausea but no significant vomiting.  He said he tried to throw up to make himself feel better.  Very little return.  A few days later he had vertigo symptoms.  This past Sunday (5 days ago) he was laying in bed and felt his heart racing lasting approximately 2 hours.  Described it as "thumping" keeping him awake.  Since that time he has not had any further symptoms.  He admits to not taking his medications for couple of days because of the nausea.  But he is back on track and is taking them as directed.    He denies chest pain or pressure, dyspnea on exertion, PND or orthopnea.  He did not take any nitroglycerin for the symptoms.  He states he never had typical chest pain prior to his CABG.  He is worried about having had Covid.  He did not take a test.  He has had to vaccines but has not had a third.   Past Medical History:  Diagnosis Date  . CAD (coronary artery disease)    hx CABG 2004, last cath 06/2013 with patent grafts  . History of nuclear stress test 05/31/2010; 2013   non-ischemic ; normal perfusion  . Hyperlipidemia     Past Surgical History:  Procedure Laterality Date  . CARDIAC CATHETERIZATION  06/2013   Patent grafts  . COLONOSCOPY N/A 09/03/2016   Procedure: COLONOSCOPY;  Surgeon: [name], MD;  Location: [facility];  Service: Endoscopy;  Laterality: N/A;  9:00 AM  . CORONARY ARTERY BYPASS GRAFT  2004   LIMA to LAD, LIMA to OM, vein to a diagonal branch, sequential vein to PDA and PLA  . LEFT HEART CATHETERIZATION WITH CORONARY ANGIOGRAM N/A 07/08/2013   Procedure: LEFT HEART CATHETERIZATION WITH CORONARY ANGIOGRAM;  Surgeon: [name], MD;  Location: [facility] CATH LAB;  Service: Cardiovascular;  Laterality: N/A;  . SHOULDER ARTHROSCOPY WITH SUBACROMIAL DECOMPRESSION, ROTATOR CUFF REPAIR AND BICEP TENDON REPAIR Left 07/27/2014   Procedure: LEFT  SHOULDER ARTHROSCOPY WITH SUBACROMIAL DECOMPRESSION, DISTAL CLAVICLE RESECTION, ROTATOR CUFF REPAIR AND POSSIBLE BICEP TENODESIS;  Surgeon: [name], MD;  Location: [facility] OR;  Service: Orthopedics;  Laterality: Left;     Current Outpatient Medications  Medication Sig Dispense Refill  . aspirin 81 MG tablet Take 81 mg by mouth daily.    Marland Kitchen atorvastatin (LIPITOR) 80 MG tablet Take 1 tablet (80 mg total) by mouth daily. 90 tablet 3  . metoprolol tartrate (LOPRESSOR) 50 MG tablet Take 0.5 tablets (25 mg total) by mouth 2 (two) times daily. 180 tablet 3  . niacin (NIASPAN) 1000 MG CR tablet Take 1 tablet (1,000 mg total) by  mouth at bedtime. 90 tablet 3  . nitroGLYCERIN (NITROSTAT) 0.4 MG SL tablet PLACE ONE TABLET UNDER THE TONGUE IF NEEDED EVERY 5 MINUTES FOR CHEST PAIN 25 tablet 3  . sildenafil (REVATIO) 20 MG tablet TAKE 3 TO 5 TABLETS BY MOUTH AS NEEDED 40 tablet 11  . triamcinolone ointment (KENALOG) 0.1 % Apply BID to rash 30 g 0   No current facility-administered medications for this visit.    Allergies:   Patient has no known allergies.    Social History:  The patient  reports that he has never smoked. He has never used smokeless tobacco. He reports that he does not drink alcohol and does not use drugs.   Family History:  The patient's family history includes CAD in his brother and unknown relative; Cancer in his mother; Heart attack in his mother; Heart attack (age of onset: 34) in his brother; Heart disease in his father and mother; Hyperlipidemia in his unknown relative; Hypertension in his mother; Prostate cancer in his brother.    ROS: All other systems are reviewed and negative. Unless otherwise mentioned in H&P    PHYSICAL EXAM: VS:  BP 130/74   Pulse 67   Ht 6' (1.829 m)   Wt 249 lb 6.4 oz (113.1 kg)   SpO2 97%   BMI 33.82 kg/m  , BMI Body mass index is 33.82 kg/m. GEN: Well nourished, well developed, in no acute distress HEENT: normal Neck: no JVD, carotid bruits, or masses Cardiac: RRR; occasional extrasystole no murmurs, rubs, or gallops,no edema  Respiratory:  Clear to auscultation bilaterally, normal work of breathing GI: soft, nontender, nondistended, + BS MS: no deformity or atrophy Skin: warm and dry, no rash Neuro:  Strength and sensation are intact Psych: euthymic mood, full affect   EKG: Personally reviewed: Normal sinus rhythm with ST abnormality noted in the lateral leads, which is nonspecific.  Heart rate of 67 bpm.  (Compared to prior EKG very subtle changes and ST).  Recent Labs: 11/24/2019: ALT 20; BUN 13; Creatinine, Ser 0.95; Potassium 4.2; Sodium 140     Lipid Panel    Component Value Date/Time   CHOL 103 11/24/2019 0841   TRIG 96 11/24/2019 0841   HDL 31 (L) 11/24/2019 0841   CHOLHDL 3.3 11/24/2019 0841   CHOLHDL 2.9 07/11/2014 1221   VLDL 19 07/11/2014 1221   LDLCALC 53 11/24/2019 0841      Wt Readings from Last 3 Encounters:  08/24/20 249 lb 6.4 oz (113.1 kg)  11/29/19 251 lb 9.6 oz (114.1 kg)  10/18/19 255 lb 3.2 oz (115.8 kg)      ASSESSMENT AND PLAN:  1.  Palpitations: I am Dayna order an echocardiogram as 1 has not been completed in several years to evaluate for changes in LV systolic function causing his symptoms.  I will  also check a BMET, magnesium, and a TSH to evaluate for electrolyte and endocrine imbalances which may also be causing his symptoms.  I have explained this to the patient is willing to have blood drawn.  2.  Coronary artery disease: I am going to repeat his nuclear medicine stress test to evaluate for changes in perfusion which may also be contributing to his palpitations.  I discussed with this patient who verbalizes understanding and is willing to proceed with stress test  3.  Recent head cold with cough and congestion: There were question a Covid antibody test which will be added to his labs.  He is also advised to get his third vaccine for full protection.  4.  Hypercholesterolemia: I have reviewed recent labs drawn in May.  LDL 58.  He states he has these drawn annually.  Continue atorvastatin at current dose.  Current medicines are reviewed at length with the patient today.  I have spent 25 minutes dedicated to the care of this patient on the date of this encounter to include pre-visit review of records, assessment, management and diagnostic testing,with shared decision making.  Labs/ tests ordered today include: Nuclear medicine stress test, echocardiogram, BMET, magnesium, Covid antibody titer.  Bettey Mare. Liborio Nixon, ANP, [facility]   08/24/2020 10:48 AM    [facility]  [facility] [address] Office [phone] Fax [phone]  Notice: This dictation was prepared with Dragon dictation along with smaller phrase technology. Any transcriptional errors that result from this process are unintentional and may not be corrected upon review.

## 2020-08-24 ENCOUNTER — Encounter: Payer: Self-pay | Admitting: Adult Health

## 2020-08-24 ENCOUNTER — Ambulatory Visit: Payer: Medicare Other | Admitting: Adult Health

## 2020-08-24 ENCOUNTER — Encounter: Payer: Self-pay | Admitting: Cardiovascular Disease

## 2020-08-24 ENCOUNTER — Other Ambulatory Visit: Payer: Self-pay

## 2020-08-24 VITALS — BP 130/74 | HR 67 | Ht 72.0 in | Wt 249.4 lb

## 2020-08-24 DIAGNOSIS — R002 Palpitations: Secondary | ICD-10-CM | POA: Diagnosis not present

## 2020-08-24 DIAGNOSIS — R059 Cough, unspecified: Secondary | ICD-10-CM

## 2020-08-24 DIAGNOSIS — R509 Fever, unspecified: Secondary | ICD-10-CM | POA: Diagnosis not present

## 2020-08-24 DIAGNOSIS — I2510 Atherosclerotic heart disease of native coronary artery without angina pectoris: Secondary | ICD-10-CM | POA: Diagnosis not present

## 2020-08-24 DIAGNOSIS — Z79899 Other long term (current) drug therapy: Secondary | ICD-10-CM

## 2020-08-24 DIAGNOSIS — E782 Mixed hyperlipidemia: Secondary | ICD-10-CM

## 2020-08-24 LAB — MAGNESIUM: Magnesium: 2.3 mg/dL (ref 1.6–2.3)

## 2020-08-24 LAB — TSH: TSH: 1.29 u[IU]/mL (ref 0.450–4.500)

## 2020-08-24 LAB — CBC
Hematocrit: 42.5 % (ref 37.5–51.0)
Hemoglobin: 14.5 g/dL (ref 13.0–17.7)
MCH: 26.9 pg (ref 26.6–33.0)
MCHC: 34.1 g/dL (ref 31.5–35.7)
MCV: 79 fL (ref 79–97)
Platelets: 296 10*3/uL (ref 150–450)
RBC: 5.4 x10E6/uL (ref 4.14–5.80)
RDW: 12 % (ref 11.6–15.4)
WBC: 5.2 10*3/uL (ref 3.4–10.8)

## 2020-08-24 LAB — BASIC METABOLIC PANEL
BUN/Creatinine Ratio: 11 (ref 10–24)
BUN: 9 mg/dL (ref 8–27)
CO2: 25 mmol/L (ref 20–29)
Calcium: 9.1 mg/dL (ref 8.6–10.2)
Chloride: 103 mmol/L (ref 96–106)
Creatinine, Ser: 0.81 mg/dL (ref 0.76–1.27)
GFR calc Af Amer: 108 mL/min/{1.73_m2} (ref >59)
GFR calc non Af Amer: 93 mL/min/{1.73_m2} (ref >59)
Glucose: 91 mg/dL (ref 65–99)
Potassium: 4.9 mmol/L (ref 3.5–5.2)
Sodium: 140 mmol/L (ref 134–144)

## 2020-08-24 NOTE — Addendum Note (Signed)
Addended by: Barrie Dunker on: 08/24/2020 02:10 PM   Modules accepted: Orders

## 2020-08-24 NOTE — Patient Instructions (Signed)
Medication Instructions:  Continue current medications  *If you need a refill on your cardiac medications before your next appointment, please call your pharmacy*   Lab Work: TSH, CBC, BMP, and Magnesium  If you have labs (blood work) drawn today and your tests are completely normal, you will receive your results only by: Marland Kitchen MyChart Message (if you have MyChart) OR . A paper copy in the mail If you have any lab test that is abnormal or we need to change your treatment, we will call you to review the results.   Testing/Procedures: Your physician has requested that you have an echocardiogram. Echocardiography is a painless test that uses sound waves to create images of your heart. It provides your doctor with information about the size and shape of your heart and how well your heart's chambers and valves are working. This procedure takes approximately one hour. There are no restrictions for this procedure.  Your physician has requested that you have en exercise stress myoview. For further information please visit [email or web address] Please follow instruction sheet, as given.  Follow-Up: At [facility], you and your health needs are our priority.  As part of our continuing mission to provide you with exceptional heart care, we have created designated Provider Care Teams.  These Care Teams include your primary Cardiologist (physician) and Advanced Practice Providers (APPs -  Physician Assistants and Nurse Practitioners) who all work together to provide you with the care you need, when you need it.  We recommend signing up for the patient portal called "MyChart".  Sign up information is provided on this After Visit Summary.  MyChart is used to connect with patients for Virtual Visits (Telemedicine).  Patients are able to view lab/test results, encounter notes, upcoming appointments, etc.  Non-urgent messages can be sent to your provider as well.   To learn more about what you can do with  MyChart, go to [email or web address].    Your next appointment:   6 week(s)  The format for your next appointment:   In Person  Provider:   You may see Nanetta Batty, MD or one of the following Advanced Practice Providers on your designated Care Team:    Corine Shelter, PA-C  [place], [place]  Edd Fabian, [place]

## 2020-08-24 NOTE — Addendum Note (Signed)
Addended by: Jodelle Gross on: 08/24/2020 02:11 PM   Modules accepted: Orders

## 2020-08-25 LAB — SARS-COV-2 ANTIBODY, IGM: SARS-CoV-2 Antibody, IgM: NEGATIVE

## 2020-09-11 ENCOUNTER — Other Ambulatory Visit (HOSPITAL_COMMUNITY): Payer: Medicare Other

## 2020-09-14 ENCOUNTER — Other Ambulatory Visit: Payer: Self-pay

## 2020-09-14 ENCOUNTER — Ambulatory Visit (HOSPITAL_COMMUNITY): Payer: Medicare Other | Attending: Internal Medicine

## 2020-09-14 ENCOUNTER — Encounter (HOSPITAL_COMMUNITY): Payer: Medicare Other

## 2020-09-14 DIAGNOSIS — I517 Cardiomegaly: Secondary | ICD-10-CM | POA: Diagnosis not present

## 2020-09-14 DIAGNOSIS — R002 Palpitations: Secondary | ICD-10-CM | POA: Insufficient documentation

## 2020-09-14 DIAGNOSIS — I2510 Atherosclerotic heart disease of native coronary artery without angina pectoris: Secondary | ICD-10-CM | POA: Insufficient documentation

## 2020-09-14 DIAGNOSIS — E785 Hyperlipidemia, unspecified: Secondary | ICD-10-CM | POA: Diagnosis not present

## 2020-09-14 LAB — ECHOCARDIOGRAM COMPLETE
Area-P 1/2: 3.03 cm2
S' Lateral: 2.8 cm

## 2020-09-17 ENCOUNTER — Telehealth (HOSPITAL_COMMUNITY): Payer: Self-pay | Admitting: *Deleted

## 2020-09-17 NOTE — Telephone Encounter (Signed)
Patient given detailed instructions per Myocardial Perfusion Study Information Sheet for the test on 09/21/20. Patient notified to arrive 15 minutes early and that it is imperative to arrive on time for appointment to keep from having the test rescheduled.  If you need to cancel or reschedule your appointment, please call the office within 24 hours of your appointment. . Patient verbalized understanding. Ronak Duquette Jacqueline    

## 2020-09-19 ENCOUNTER — Other Ambulatory Visit (HOSPITAL_COMMUNITY)
Admission: RE | Admit: 2020-09-19 | Discharge: 2020-09-19 | Disposition: A | Discharge status: Home / Self Care | Payer: Medicare Other | Source: Ambulatory Visit | Attending: Adult Health | Admitting: Adult Health

## 2020-09-19 DIAGNOSIS — Z20822 Contact with and (suspected) exposure to covid-19: Secondary | ICD-10-CM | POA: Insufficient documentation

## 2020-09-19 DIAGNOSIS — Z01812 Encounter for preprocedural laboratory examination: Secondary | ICD-10-CM | POA: Diagnosis not present

## 2020-09-19 LAB — SARS CORONAVIRUS 2 (TAT 6-24 HRS): SARS Coronavirus 2: NEGATIVE

## 2020-09-21 ENCOUNTER — Other Ambulatory Visit: Payer: Self-pay

## 2020-09-21 ENCOUNTER — Ambulatory Visit (HOSPITAL_COMMUNITY): Payer: Medicare Other | Attending: Cardiovascular Disease

## 2020-09-21 DIAGNOSIS — I2510 Atherosclerotic heart disease of native coronary artery without angina pectoris: Secondary | ICD-10-CM

## 2020-09-21 LAB — MYOCARDIAL PERFUSION IMAGING
Estimated workload: 7.6 METS
Exercise duration (min): 6 min
Exercise duration (sec): 26 s
LV dias vol: 93 mL (ref 62–150)
LV sys vol: 31 mL
MPHR: 155 {beats}/min
Peak HR: 155 {beats}/min
Percent HR: 100 %
Rest HR: 62 {beats}/min
SDS: 1
SRS: 0
SSS: 1
TID: 0.78

## 2020-09-21 MED ORDER — TECHNETIUM TC 99M TETROFOSMIN IV KIT
31.3000 | PACK | Freq: Once | INTRAVENOUS | Status: AC | PRN
  Administered 2020-09-21: 31.3 via INTRAVENOUS
  Filled 2020-09-21: qty 32

## 2020-09-21 MED ORDER — TECHNETIUM TC 99M TETROFOSMIN IV KIT
10.9000 | PACK | Freq: Once | INTRAVENOUS | Status: AC | PRN
  Administered 2020-09-21: 10.9 via INTRAVENOUS
  Filled 2020-09-21: qty 11

## 2020-10-05 ENCOUNTER — Other Ambulatory Visit: Payer: Self-pay

## 2020-10-05 ENCOUNTER — Encounter: Payer: Self-pay | Admitting: Cardiovascular Disease

## 2020-10-05 ENCOUNTER — Ambulatory Visit (INDEPENDENT_AMBULATORY_CARE_PROVIDER_SITE_OTHER): Payer: Medicare Other

## 2020-10-05 ENCOUNTER — Ambulatory Visit: Payer: Medicare Other | Admitting: Cardiovascular Disease

## 2020-10-05 ENCOUNTER — Encounter: Payer: Self-pay | Admitting: Radiology

## 2020-10-05 VITALS — BP 134/78 | HR 80 | Ht 72.0 in | Wt 256.0 lb

## 2020-10-05 DIAGNOSIS — I2510 Atherosclerotic heart disease of native coronary artery without angina pectoris: Secondary | ICD-10-CM

## 2020-10-05 DIAGNOSIS — R002 Palpitations: Secondary | ICD-10-CM | POA: Insufficient documentation

## 2020-10-05 DIAGNOSIS — E782 Mixed hyperlipidemia: Secondary | ICD-10-CM | POA: Diagnosis not present

## 2020-10-05 MED ORDER — METOPROLOL TARTRATE 50 MG PO TABS: 25.0000 mg | ORAL_TABLET | Freq: Two times a day (BID) | ORAL | 3 refills | Status: DC

## 2020-10-05 MED ORDER — NIACIN ER (ANTIHYPERLIPIDEMIC) 1000 MG PO TBCR: 1000.0000 mg | EXTENDED_RELEASE_TABLET | Freq: Every day | ORAL | 3 refills | Status: DC

## 2020-10-05 MED ORDER — ATORVASTATIN CALCIUM 80 MG PO TABS: 80.0000 mg | ORAL_TABLET | Freq: Every day | ORAL | 3 refills | Status: DC

## 2020-10-05 NOTE — Progress Notes (Signed)
Enrolled patient for a 14 day Zio XT Monitor to be mailed to patients home  

## 2020-10-05 NOTE — Assessment & Plan Note (Signed)
History of CAD status post coronary artery bypass grafting in 2004 with a LIMA to the LAD, RIMA to an OM vein to diagonal branch and sequential vein to the PDA and PLA.  He underwent repeat cardiac catheterization 07/08/2013 revealing patent grafts.  He recently saw Joni Reining, NP in the office who ordered a Myoview and stress test last month that were normal.  His only complaint is occasional palpitations.

## 2020-10-05 NOTE — Assessment & Plan Note (Signed)
History of hyperlipidemia on statin therapy with lipid profile performed 11/24/2019 revealing total cholesterol of 103, LDL 33 and HDL 31.

## 2020-10-05 NOTE — Patient Instructions (Addendum)
Medication Instructions:  Your physician recommends that you continue on your current medications as directed. Please refer to the Current Medication list given to you today.  *If you need a refill on your cardiac medications before your next appointment, please call your pharmacy*   Testing/Procedures:  ZIO XT- Long Term Monitor Instructions   Your physician has requested you wear your ZIO patch monitor 14 days.   This is a single patch monitor.  [employer] supplies one patch monitor per enrollment.  Additional stickers are not available.   Please do not apply patch if you will be having a Nuclear Stress Test, Echocardiogram, Cardiac CT, MRI, or Chest Xray during the time frame you would be wearing the monitor. The patch cannot be worn during these tests.  You cannot remove and re-apply the ZIO XT patch monitor.   Your ZIO patch monitor will be sent USPS Priority mail from [facility] directly to your home address. The monitor may also be mailed to a PO BOX if home delivery is not available.   It may take 3-5 days to receive your monitor after you have been enrolled.   Once you have received you monitor, please review enclosed instructions.  Your monitor has already been registered assigning a specific monitor serial # to you.   Applying the monitor   Shave hair from upper left chest.   Hold abrader disc by orange tab.  Rub abrader in 40 strokes over left upper chest as indicated in your monitor instructions.   Clean area with 4 enclosed alcohol pads .  Use all pads to assure are is cleaned thoroughly.  Let dry.   Apply patch as indicated in monitor instructions.  Patch will be place under collarbone on left side of chest with arrow pointing upward.   Rub patch adhesive wings for 2 minutes.Remove white label marked "1".  Remove white label marked "2".  Rub patch adhesive wings for 2 additional minutes.   While looking in a mirror, press and release button in center of patch.  A  small green light will flash 3-4 times .  This will be your only indicator the monitor has been turned on.     Do not shower for the first 24 hours.  You may shower after the first 24 hours.   Press button if you feel a symptom. You will hear a small click.  Record Date, Time and Symptom in the Patient Log Book.   When you are ready to remove patch, follow instructions on last 2 pages of Patient Log Book.  Stick patch monitor onto last page of Patient Log Book.   Place Patient Log Book in Equ[employer] box.  Use locking tab on box and tape box closed securely.  The [employer] and [employer] has [employer] on it.  Please place in mailbox as soon as possible.  Your physician should have your test results approximately 7 days after the monitor has been mailed back to [facility].   Call [facility] Customer Care at [phone] if you have questions regarding your ZIO XT patch monitor.  Call them immediately if you see an orange light blinking on your monitor.   If your monitor falls off in less than 4 days contact our Monitor department at [phone].  If your monitor becomes loose or falls off after 4 days call [employer] at [phone] for suggestions on securing your monitor.     Your physician has requested that you have an echocardiogram. Echocardiography is a painless  test that uses sound waves to create images of your heart. It provides your doctor with information about the size and shape of your heart and how well your heart's chambers and valves are working. This procedure takes approximately one hour. There are no restrictions for this procedure.  This procedure is done at [address]. To be repeated in Feb. 2023.   Follow-Up: At [facility], you and your health needs are our priority.  As part of our continuing mission to provide you with exceptional heart care, we have created designated Provider Care Teams.  These Care Teams include your primary Cardiologist  (physician) and Advanced Practice Providers (APPs -  Physician Assistants and Nurse Practitioners) who all work together to provide you with the care you need, when you need it.  We recommend signing up for the patient portal called "MyChart".  Sign up information is provided on this After Visit Summary.  MyChart is used to connect with patients for Virtual Visits (Telemedicine).  Patients are able to view lab/test results, encounter notes, upcoming appointments, etc.  Non-urgent messages can be sent to your provider as well.   To learn more about what you can do with MyChart, go to [email or web address].    Your next appointment:   6 month(s)  The format for your next appointment:   In Person  Provider:   You will see one of the following Advanced Practice Providers on your designated Care Team:    Joni Reining, DNP, ANP  Then, Nanetta Batty, MD will plan to see you again in 12 month(s).

## 2020-10-05 NOTE — Progress Notes (Signed)
10/05/2020 William Strickland   1956-01-05  [id number]  Primary Physician [name], MD (Inactive) Primary Cardiologist: [name] MD [name], [place], [place]  HPI:  William Strickland is a 65 y.o.  mildly overweight widowed although remarried 3 years ago Caucasian male father of 3 children, grandfather to 6 grandchildren who is accompanied by his wife  [name] . I last saw in the office 11/29/2019. He has a history of CAD status post coronary artery bypass grafting in 2004 with an LIMA graft to the LAD, RIMA graft to an OM branch, a vein to diagonal branch, and a sequential vein to the PDA/PL A. His other problems include hyperlipidemia. His recent lipid profile performed 2/7/2019revealed an LDL of47 and an HDL of 35. He had a nonischemic Myoview 06/04/12.   Since I saw him a year ago he continues to do well.    He apparently had upper respiratory tract infection in January but has since had Covid antibodies drawn which were negative.   Denies chest pain or shortness of breath.  Recent lipid profile performed 11/24/2019 revealed a total cholesterol 103, LDL 53 and HDL 31.  He did see Joni Reining in the office 08/16/2020 who ordered a 2D echo and a Myoview stress test 09/14/2020 he does complain of palpitations that occur on occasion at night.   Current Meds  Medication Sig  . aspirin 81 MG tablet Take 81 mg by mouth daily.  Marland Kitchen atorvastatin (LIPITOR) 80 MG tablet Take 1 tablet (80 mg total) by mouth daily.  . metoprolol tartrate (LOPRESSOR) 50 MG tablet Take 0.5 tablets (25 mg total) by mouth 2 (two) times daily.  . niacin (NIASPAN) 1000 MG CR tablet Take 1 tablet (1,000 mg total) by mouth at bedtime.  . nitroGLYCERIN (NITROSTAT) 0.4 MG SL tablet PLACE ONE TABLET UNDER THE TONGUE IF NEEDED EVERY 5 MINUTES FOR CHEST PAIN  . sildenafil (REVATIO) 20 MG tablet TAKE 3 TO 5 TABLETS BY MOUTH AS NEEDED  . triamcinolone ointment (KENALOG) 0.1 % Apply BID to rash     No Known  Allergies  Social History   Socioeconomic History  . Marital status: Widowed    Spouse name: Not on file  . Number of children: 3  . Years of education: Not on file  . Highest education level: Not on file  Occupational History  . Not on file  Tobacco Use  . Smoking status: Never Smoker  . Smokeless tobacco: Never Used  Substance and Sexual Activity  . Alcohol use: No  . Drug use: No  . Sexual activity: Yes    Partners: Male  Other Topics Concern  . Not on file  Social History Narrative  . Not on file   Social Determinants of Health   Financial Resource Strain: Not on file  Food Insecurity: Not on file  Transportation Needs: Not on file  Physical Activity: Not on file  Stress: Not on file  Social Connections: Not on file  Intimate Partner Violence: Not on file     Review of Systems: General: negative for chills, fever, night sweats or weight changes.  Cardiovascular: negative for chest pain, dyspnea on exertion, edema, orthopnea, palpitations, paroxysmal nocturnal dyspnea or shortness of breath Dermatological: negative for rash Respiratory: negative for cough or wheezing Urologic: negative for hematuria Abdominal: negative for nausea, vomiting, diarrhea, bright red blood per rectum, melena, or hematemesis Neurologic: negative for visual changes, syncope, or dizziness All other systems reviewed and are otherwise  negative except as noted above.    Blood pressure 134/78, pulse 80, height 6' (1.829 m), weight 256 lb (116.1 kg).  General appearance: alert and no distress Neck: no adenopathy, no carotid bruit, no JVD, supple, symmetrical, trachea midline and thyroid not enlarged, symmetric, no tenderness/mass/nodules Lungs: clear to auscultation bilaterally Heart: regular rate and rhythm, S1, S2 normal, no murmur, click, rub or gallop Extremities: extremities normal, atraumatic, no cyanosis or edema Pulses: 2+ and symmetric Skin: Skin color, texture, turgor normal. No  rashes or lesions Neurologic: Alert and oriented X 3, normal strength and tone. Normal symmetric reflexes. Normal coordination and gait  EKG not performed today  ASSESSMENT AND PLAN:   Coronary artery disease, 2004 CABG, cath 07/08/13 with patent grafts History of CAD status post coronary artery bypass grafting in 2004 with a LIMA to the LAD, RIMA to an OM vein to diagonal branch and sequential vein to the PDA and PLA.  He underwent repeat cardiac catheterization 07/08/2013 revealing patent grafts.  He recently saw Joni Reining, NP in the office who ordered a Myoview and stress test last month that were normal.  His only complaint is occasional palpitations.  Hyperlipidemia History of hyperlipidemia on statin therapy with lipid profile performed 11/24/2019 revealing total cholesterol of 103, LDL 33 and HDL 31.  Palpitations Occasional palpitations occurring at night since January.  He does drink caffeine which she has agreed to discontinue.  We will get a 2-week Zio patch to further evaluate.      [name] MD FACP,FACC,FAHA, [facility] 10/05/2020 8:51 AM

## 2020-10-05 NOTE — Assessment & Plan Note (Signed)
Occasional palpitations occurring at night since January.  He does drink caffeine which she has agreed to discontinue.  We will get a 2-week Zio patch to further evaluate.

## 2020-10-12 DIAGNOSIS — R002 Palpitations: Secondary | ICD-10-CM | POA: Diagnosis not present

## 2020-10-12 DIAGNOSIS — I2510 Atherosclerotic heart disease of native coronary artery without angina pectoris: Secondary | ICD-10-CM | POA: Diagnosis not present

## 2021-03-04 ENCOUNTER — Telehealth: Payer: Self-pay | Admitting: Cardiovascular Disease

## 2021-03-04 NOTE — Telephone Encounter (Signed)
STAT if HR is under 50 or over 120 (normal HR is 60-100 beats per minute)  What is your heart rate?  Patient is not sure. He does not have a way to check it now. It feels more normal at the time of the call   Do you have a log of your heart rate readings (document readings)?  180-200 on Friday.   Do you have any other symptoms? No  Patient said his heart was beating very quickly on Friday. He went to the fire department to have it checked and it was fluctuating between 180-200. The patient took an additional 50 mg of metoprolol and a nitroglycerin and his HR seemed to resolve itself. He does admit to having two glasses of tea with dinner.   He was not sure if he should increase his medication regularly or not.   Please advise

## 2021-03-04 NOTE — Telephone Encounter (Signed)
Called patient, he states that on Friday he had an episode of his heart beating fast. He went to the fire department and had them check it, it was running 180-200 bpm. I did advise that his previous monitor from back in March/April showed he had some fast hr's mentioned on this as well, and he states he feels it is getting worse. His bp has been 135/120 during this episode (he was unsure of the bottom number), when normally his BP is 120/70-80. He took another Metoprolol dose during this episode and Nitroglycerin and it helped his HR. He has not had any issues all weekend, and feels fine today. But he thinks the Metoprolol should be increased to see if this would help, as he has had come and go palpitation feeling, and they are worsening. He does mention being outside in the hot, and I recommended to reduce caffeine intake and drink more water, he will try this (also mentions this in last note from March with MD) did advise I would route to MD to advise.   Thank you!

## 2021-03-06 MED ORDER — METOPROLOL TARTRATE 50 MG PO TABS: 50.0000 mg | ORAL_TABLET | Freq: Two times a day (BID) | ORAL | 3 refills | Status: DC

## 2021-03-06 NOTE — Telephone Encounter (Signed)
Called patient back. Advised of increase of medication, made appointment with Marjie Skiff, PA-C for follow up in 4-6 weeks.  Patient verbalized understanding.

## 2021-03-14 ENCOUNTER — Encounter: Payer: Self-pay | Admitting: Cardiovascular Disease

## 2021-03-30 NOTE — Progress Notes (Deleted)
Cardiology Office Note:    Date:  03/30/2021   ID:  William Strickland, DOB 18-Apr-1956, MRN [id number]  PCP:  [name], MD (Inactive)  Cardiologist:  None  Electrophysiologist:  None   Referring MD: No ref. provider found   Chief Complaint: follow-up of palpitations and monitor results  History of Present Illness:    William Strickland is a 65 y.o. male with a history of CAD s/p CABG x4 (LIMA-LAD, RIMA-OM, SVG-Diag, SVG-PDA/PLA) in 2004, palpitations with PACs/PVCs and short runs of NSVT/SVT noted on monitor in 09/2020, hyperlipidemia who is followed by Dr. Allyson Sabal and presents today for routine follow-up of palpitations and monitor results.  Patient has a long history of CAD s/p remote CABG x4 in 2004. Last cath in 2018 showed widely patent grafts. Patient was seen in our office in 07/2020 with reports of palpitations. Echo and Myoview were ordered. 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 Myoview was low risk with no evidence of ischemia or infarction. She was seen by Dr. Allyson Sabal in 09/2020 for follow-up and continued to complain of palpitations at night. Zio Monitor was ordered and showed underlying sinus rhythm with occasional PACs/PVCs and short runs of paroxysmal SVT and NSVT.   Patient presents today for follow-up. ***  CAD - S/p CABG x4 in 2004. LHC in 2014 showed patent grafts. Recent Myoview in 08/2020 was low risk with no ischemia. - No angina. - Continue aspirin, beta-blocker, and high-intensity statin.   Palpitations - Recent monitor showed occasional PACs/PVCs and short runs of NSVT and paroxysmal SVT.  - Stable. - Continue Lopressor 50mg  twice daily.   Hyperlipidemia - Lipid panel *** - Continue Lipitor 80mg  daily.   Past Medical History:  Diagnosis Date   CAD (coronary artery disease)    hx CABG 2004, last cath 06/2013 with patent grafts   History of  nuclear stress test 05/31/2010; 2013   non-ischemic ; normal perfusion   Hyperlipidemia     Past Surgical History:  Procedure Laterality Date   CARDIAC CATHETERIZATION  06/2013   Patent grafts   COLONOSCOPY N/A 09/03/2016   Procedure: COLONOSCOPY;  Surgeon: 07/2013, MD;  Location: [facility]O SUITE;  Service: Endoscopy;  Laterality: N/A;  9:00 AM   CORONARY ARTERY BYPASS GRAFT  2004   LIMA to LAD, LIMA to OM, vein to a diagonal branch, sequential vein to PDA and PLA   LEFT HEART CATHETERIZATION WITH CORONARY ANGIOGRAM N/A 07/08/2013   Procedure: LEFT HEART CATHETERIZATION WITH CORONARY ANGIOGRAM;  Surgeon: 2005, MD;  Location: [facility] CATH LAB;  Service: Cardiovascular;  Laterality: N/A;   SHOULDER ARTHROSCOPY WITH SUBACROMIAL DECOMPRESSION, ROTATOR CUFF REPAIR AND BICEP TENDON REPAIR Left 07/27/2014   Procedure: LEFT SHOULDER ARTHROSCOPY WITH SUBACROMIAL DECOMPRESSION, DISTAL CLAVICLE RESECTION, ROTATOR CUFF REPAIR AND POSSIBLE BICEP TENODESIS;  Surgeon: [facility], MD;  Location: [facility] OR;  Service: Orthopedics;  Laterality: Left;    Current Medications: No outpatient medications have been marked as taking for the 04/05/21 encounter (Appointment) with [name], PA-C.     Allergies:   Patient has no known allergies.   Social History   Socioeconomic History   Marital status: Widowed    Spouse name: Not on file   Number of children: 3   Years of education: Not on file   Highest education level: Not on file  Occupational History  Not on file  Tobacco Use   Smoking status: Never   Smokeless tobacco: Never  Substance and Sexual Activity   Alcohol use: No   Drug use: No   Sexual activity: Yes    Partners: Male  Other Topics Concern   Not on file  Social History Narrative   ** Merged History Encounter **       Social Determinants of Health   Financial Resource Strain: Not on file  Food Insecurity: Not on file  Transportation Needs: Not on file   Physical Activity: Not on file  Stress: Not on file  Social Connections: Not on file     Family History: The patient's family history includes CAD in his brother and another family member; Cancer in his mother; Heart attack in his mother; Heart attack (age of onset: 35) in his brother; Heart disease in his father and mother; Hyperlipidemia in an other family member; Hypertension in his mother; Prostate cancer in his brother.  ROS:   Please see the history of present illness.     EKGs/Labs/Other Studies Reviewed:    The following studies were reviewed today:  Echocardiogram 09/14/2020: Impressions:  1. Left ventricular ejection fraction, by estimation, is 60 to 65%. The  left ventricle has normal function. The left ventricle has no regional  wall motion abnormalities. There is mild concentric left ventricular  hypertrophy. Left ventricular diastolic  parameters were normal. The average left ventricular global longitudinal  strain is -23.9 %. The global longitudinal strain is normal.   2. Right ventricular systolic function is normal. The right ventricular  size is normal.   3. The mitral valve is normal in structure. No evidence of mitral valve  regurgitation.   4. The aortic valve is normal in structure. Aortic valve regurgitation is  not visualized. No aortic stenosis is present.   5. Aortic dilatation noted. 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 There is mild to moderate dilatation of the  ascending aorta, measuring 44 mm.   Comparison(s): No prior Echocardiogram. _______________  Celine Ahr 09/21/2020: The left ventricular ejection fraction is hyperdynamic (>65%). Nuclear stress EF: 67%. There was no ST segment deviation noted during stress. The study is normal. This is a low risk study.   Normal resting and stress perfusion. No ischemia or infarction EF 67%. _______________  Monitor 10/12/2020 to 10/26/2020: Patient had a min HR of 43  bpm, max HR of 210 bpm, and avg HR of 79 bpm. Predominant underlying rhythm was Sinus Rhythm. First Degree AV Block was present. 1 run of Ventricular Tachycardia occurred lasting 5 beats with a max rate of 210 bpm (avg 183  bpm). 8 Supraventricular Tachycardia runs occurred, the run with the fastest interval lasting 4 beats with a max rate of 167 bpm, the longest lasting 11 beats with an avg rate of 130 bpm. Isolated SVEs were occasional (1.0%, 15928), SVE Couplets were  rare (<1.0%, 480), and SVE Triplets were rare (<1.0%, 22). Isolated VEs were rare (<1.0%, 240), VE Couplets were rare (<1.0%, 1), and VE Triplets were rare (<1.0%, 1).   1. SR/SB/ST 2. Occasional PACS/PVCs 3. Short runs of PSVT and NSVT   EKG:  EKG not ordered today.   Recent Labs: 08/24/2020: BUN 9; Creatinine, Ser 0.81; Hemoglobin 14.5; Magnesium 2.3; Platelets 296; Potassium 4.9; Sodium 140; TSH 1.290  Recent Lipid Panel    Component Value Date/Time   CHOL 103 11/24/2019 0841   TRIG 96 11/24/2019 0841  HDL 31 (L) 11/24/2019 0841   CHOLHDL 3.3 11/24/2019 0841   CHOLHDL 2.9 07/11/2014 1221   VLDL 19 07/11/2014 1221   LDLCALC 53 11/24/2019 0841    Physical Exam:    Vital Signs: There were no vitals taken for this visit.    Wt Readings from Last 3 Encounters:  10/05/20 256 lb (116.1 kg)  09/21/20 249 lb (112.9 kg)  08/24/20 249 lb 6.4 oz (113.1 kg)     General: 66 y.o. male in no acute distress. HEENT: Normocephalic and atraumatic. Sclera clear. EOMs intact. Neck: Supple. No carotid bruits. No JVD. Heart: *** RRR. Distinct S1 and S2. No murmurs, gallops, or rubs. Radial and distal pedal pulses 2+ and equal bilaterally. Lungs: No increased work of breathing. Clear to ausculation bilaterally. No wheezes, rhonchi, or rales.  Abdomen: Soft, non-distended, and non-tender to palpation. Bowel sounds present in all 4 quadrants.  MSK: Normal strength and tone for age. *** Extremities: No lower extremity edema.     Skin: Warm and dry. Neuro: Alert and oriented x3. No focal deficits. Psych: Normal affect. Responds appropriately.   Assessment:    No diagnosis found.  Plan:     Disposition: Follow up in ***   Medication Adjustments/Labs and Tests Ordered: Current medicines are reviewed at length with the patient today.  Concerns regarding medicines are outlined above.  No orders of the defined types were placed in this encounter.  No orders of the defined types were placed in this encounter.   There are no Patient Instructions on file for this visit.   Signed, Corrin Parker, PA-C  03/30/2021 10:06 AM    [facility]

## 2021-04-05 ENCOUNTER — Telehealth: Payer: Self-pay | Admitting: Cardiovascular Disease

## 2021-04-05 ENCOUNTER — Ambulatory Visit: Payer: Medicare Other | Admitting: Student

## 2021-04-05 NOTE — Telephone Encounter (Signed)
Called patient wife, they had an appointment today with [facility]- and they tested him and he was positive so they canceled. I did recommend to monitor symptoms, he is on day 5 of having symptoms at this time. I did advise I could send a message back to [facility] for any further recommendations, but if he was continuing to have symptoms to quarantine until feeling better. Patient wife verbalized understanding, thankful for call back.

## 2021-04-05 NOTE — Telephone Encounter (Signed)
William Strickland tested positive for Covid this morning. His wife is wanting to know if there is anything our office advises.

## 2021-04-08 NOTE — Telephone Encounter (Signed)
Noted. Thanks.

## 2021-07-04 ENCOUNTER — Ambulatory Visit (INDEPENDENT_AMBULATORY_CARE_PROVIDER_SITE_OTHER): Payer: Medicare Other | Admitting: Family Medicine

## 2021-07-04 ENCOUNTER — Other Ambulatory Visit: Payer: Self-pay

## 2021-07-04 VITALS — BP 133/84 | HR 60 | Temp 97.8°F | Ht 72.0 in | Wt 249.4 lb

## 2021-07-04 DIAGNOSIS — N63 Unspecified lump in unspecified breast: Secondary | ICD-10-CM | POA: Diagnosis not present

## 2021-07-04 NOTE — Assessment & Plan Note (Signed)
Suspect that this is benign. Proceeding with evaluation with mammogram and [place].

## 2021-07-04 NOTE — Progress Notes (Signed)
ul ?

## 2021-07-04 NOTE — Progress Notes (Signed)
Subjective:  Patient ID: William Strickland, male    DOB: 1956/05/31  Age: 65 y.o. MRN: [id number]  CC: Chief Complaint  Patient presents with   Establish Care    Knot on right breast    HPI:  65 year old male presents with the above complaint.  Patient reports that he has a palpable firm area of his right breast.  He noticed it approximately 1 week ago.  It is not painful.  It is at the 3 o'clock position in relation to the nipple.  No surrounding redness.  No reports of nipple discharge.  No fever.  He is feeling well otherwise.  No other complaints or concerns at this time.  Patient Active Problem List   Diagnosis Date Noted   Breast mass in male 07/04/2021   Atopic dermatitis 08/02/2019   Erectile dysfunction due to arterial insufficiency 08/29/2016   Unstable angina (HCC) 07/08/2013   Coronary artery disease, 2004 CABG, cath 07/08/13 with patent grafts 02/02/2013   Hyperlipidemia 02/02/2013    Social Hx   Social History   Socioeconomic History   Marital status: Widowed    Spouse name: Not on file   Number of children: 3   Years of education: Not on file   Highest education level: Not on file  Occupational History   Not on file  Tobacco Use   Smoking status: Never   Smokeless tobacco: Never  Substance and Sexual Activity   Alcohol use: No   Drug use: No   Sexual activity: Yes    Partners: Male  Other Topics Concern   Not on file  Social History Narrative   ** Merged History Encounter **       Social Determinants of Health   Financial Resource Strain: Not on file  Food Insecurity: Not on file  Transportation Needs: Not on file  Physical Activity: Not on file  Stress: Not on file  Social Connections: Not on file    Review of Systems  Constitutional: Negative.   Respiratory: Negative.    Cardiovascular: Negative.   Per HPI  Objective:  BP 133/84   Pulse 60   Temp 97.8 F (36.6 C) (Oral)   Ht 6' (1.829 m)   Wt 249 lb 6.4 oz (113.1 kg)   SpO2  98%   BMI 33.82 kg/m   BP/Weight 07/04/2021 10/05/2020 08/24/2020  Systolic BP 133 134 130  Diastolic BP 84 78 74  Wt. (Lbs) 249.4 256 249.4  BMI 33.82 34.72 33.82    Physical Exam Vitals and nursing note reviewed.  Constitutional:      General: He is not in acute distress.    Appearance: Normal appearance. He is not ill-appearing.  Eyes:     General:        Right eye: No discharge.        Left eye: No discharge.     Conjunctiva/sclera: Conjunctivae normal.  Pulmonary:     Effort: Pulmonary effort is normal. No respiratory distress.  Chest:       Comments: Small, palpable firm area located 7 cm from the center of the nipple at the 3 o'clock position.  Area feels slightly movable. Neurological:     Mental Status: He is alert.  Psychiatric:        Mood and Affect: Mood normal.        Behavior: Behavior normal.    Lab Results  Component Value Date   WBC 5.2 08/24/2020   HGB 14.5 08/24/2020   HCT 42.5  08/24/2020   PLT 296 08/24/2020   GLUCOSE 91 08/24/2020   CHOL 103 11/24/2019   TRIG 96 11/24/2019   HDL 31 (L) 11/24/2019   LDLCALC 53 11/24/2019   ALT 20 11/24/2019   AST 21 11/24/2019   NA 140 08/24/2020   K 4.9 08/24/2020   CL 103 08/24/2020   CREATININE 0.81 08/24/2020   BUN 9 08/24/2020   CO2 25 08/24/2020   TSH 1.290 08/24/2020   INR 0.97 07/18/2014   HGBA1C 6.1 (H) 07/08/2013     Assessment & Plan:   Problem List Items Addressed This Visit       Other   Breast mass in male - Primary    Suspect that this is benign. Proceeding with evaluation with mammogram and [place].       Relevant Orders   US BREAST LTD UNI RIGHT INC AXILLA   MM DIAG BREAST TOMO BILATERAL   Follow-up:  Pending results  Everlene Other DO [facility]

## 2021-07-04 NOTE — Patient Instructions (Signed)
Appt Friday 07/12/21 @ 9 am  [facility]  [address], [place], [place] [address]  Follow up early next year.  Take care  Dr. Adriana Simas

## 2021-07-12 ENCOUNTER — Ambulatory Visit
Admission: RE | Admit: 2021-07-12 | Discharge: 2021-07-12 | Disposition: A | Discharge status: Home / Self Care | Payer: Medicare Other | Source: Ambulatory Visit | Attending: Family Medicine | Admitting: Family Medicine

## 2021-07-12 DIAGNOSIS — N62 Hypertrophy of breast: Secondary | ICD-10-CM | POA: Diagnosis not present

## 2021-08-28 ENCOUNTER — Other Ambulatory Visit: Payer: Self-pay

## 2021-08-28 ENCOUNTER — Ambulatory Visit (HOSPITAL_COMMUNITY): Payer: Medicare Other | Attending: Cardiovascular Disease

## 2021-08-28 ENCOUNTER — Other Ambulatory Visit: Payer: Self-pay | Admitting: *Deleted

## 2021-08-28 DIAGNOSIS — E782 Mixed hyperlipidemia: Secondary | ICD-10-CM | POA: Insufficient documentation

## 2021-08-28 DIAGNOSIS — R002 Palpitations: Secondary | ICD-10-CM

## 2021-08-28 DIAGNOSIS — I2510 Atherosclerotic heart disease of native coronary artery without angina pectoris: Secondary | ICD-10-CM | POA: Diagnosis not present

## 2021-08-28 DIAGNOSIS — I712 Thoracic aortic aneurysm, without rupture, unspecified: Secondary | ICD-10-CM

## 2021-08-28 LAB — ECHOCARDIOGRAM COMPLETE
Area-P 1/2: 2.72 cm2
S' Lateral: 3.6 cm

## 2021-09-03 ENCOUNTER — Telehealth: Payer: Self-pay | Admitting: Family Medicine

## 2021-09-03 DIAGNOSIS — Z125 Encounter for screening for malignant neoplasm of prostate: Secondary | ICD-10-CM

## 2021-09-03 DIAGNOSIS — Z79899 Other long term (current) drug therapy: Secondary | ICD-10-CM

## 2021-09-03 DIAGNOSIS — Z0000 Encounter for general adult medical examination without abnormal findings: Secondary | ICD-10-CM

## 2021-09-03 DIAGNOSIS — E7800 Pure hypercholesterolemia, unspecified: Secondary | ICD-10-CM

## 2021-09-03 NOTE — Telephone Encounter (Signed)
Lab order placed and pt is aware 

## 2021-09-03 NOTE — Telephone Encounter (Signed)
Last labs completed by our office 11/24/19 PSA, BMET, HEPATIC, LIPID. Please advise. Thank you.

## 2021-09-03 NOTE — Telephone Encounter (Signed)
Pt has physical 09/10/2021 at 9:00 am with Dr Lacinda Axon.

## 2021-09-06 DIAGNOSIS — Z0000 Encounter for general adult medical examination without abnormal findings: Secondary | ICD-10-CM | POA: Diagnosis not present

## 2021-09-06 DIAGNOSIS — Z125 Encounter for screening for malignant neoplasm of prostate: Secondary | ICD-10-CM | POA: Diagnosis not present

## 2021-09-06 DIAGNOSIS — E7800 Pure hypercholesterolemia, unspecified: Secondary | ICD-10-CM | POA: Diagnosis not present

## 2021-09-06 DIAGNOSIS — Z79899 Other long term (current) drug therapy: Secondary | ICD-10-CM | POA: Diagnosis not present

## 2021-09-07 LAB — CBC WITH DIFFERENTIAL/PLATELET
Basophils Absolute: 0 10*3/uL (ref 0.0–0.2)
Basos: 0 %
EOS (ABSOLUTE): 0.3 10*3/uL (ref 0.0–0.4)
Eos: 6 %
Hematocrit: 44.3 % (ref 37.5–51.0)
Hemoglobin: 14.5 g/dL (ref 13.0–17.7)
Immature Grans (Abs): 0 10*3/uL (ref 0.0–0.1)
Immature Granulocytes: 0 %
Lymphocytes Absolute: 2 10*3/uL (ref 0.7–3.1)
Lymphs: 40 %
MCH: 26.3 pg — ABNORMAL LOW (ref 26.6–33.0)
MCHC: 32.7 g/dL (ref 31.5–35.7)
MCV: 80 fL (ref 79–97)
Monocytes Absolute: 0.5 10*3/uL (ref 0.1–0.9)
Monocytes: 10 %
Neutrophils Absolute: 2.2 10*3/uL (ref 1.4–7.0)
Neutrophils: 44 %
Platelets: 293 10*3/uL (ref 150–450)
RBC: 5.52 x10E6/uL (ref 4.14–5.80)
RDW: 13 % (ref 11.6–15.4)
WBC: 4.9 10*3/uL (ref 3.4–10.8)

## 2021-09-07 LAB — CMP14+EGFR
ALT: 20 IU/L (ref 0–44)
AST: 19 IU/L (ref 0–40)
Albumin/Globulin Ratio: 1.9 (ref 1.2–2.2)
Albumin: 4.6 g/dL (ref 3.8–4.8)
Alkaline Phosphatase: 100 IU/L (ref 44–121)
BUN/Creatinine Ratio: 13 (ref 10–24)
BUN: 13 mg/dL (ref 8–27)
Bilirubin Total: 0.8 mg/dL (ref 0.0–1.2)
CO2: 24 mmol/L (ref 20–29)
Calcium: 9.4 mg/dL (ref 8.6–10.2)
Chloride: 103 mmol/L (ref 96–106)
Creatinine, Ser: 0.98 mg/dL (ref 0.76–1.27)
Globulin, Total: 2.4 g/dL (ref 1.5–4.5)
Glucose: 91 mg/dL (ref 70–99)
Potassium: 4.5 mmol/L (ref 3.5–5.2)
Sodium: 139 mmol/L (ref 134–144)
Total Protein: 7 g/dL (ref 6.0–8.5)
eGFR: 85 mL/min/{1.73_m2} (ref >59)

## 2021-09-07 LAB — LIPID PANEL
Chol/HDL Ratio: 2.8 ratio (ref 0.0–5.0)
Cholesterol, Total: 97 mg/dL — ABNORMAL LOW (ref 100–199)
HDL: 35 mg/dL — ABNORMAL LOW (ref >39)
LDL Chol Calc (NIH): 46 mg/dL (ref 0–99)
Triglycerides: 74 mg/dL (ref 0–149)
VLDL Cholesterol Cal: 16 mg/dL (ref 5–40)

## 2021-09-07 LAB — PSA: Prostate Specific Ag, Serum: 1.5 ng/mL (ref 0.0–4.0)

## 2021-09-10 ENCOUNTER — Encounter: Payer: Self-pay | Admitting: Family Medicine

## 2021-09-10 ENCOUNTER — Ambulatory Visit (INDEPENDENT_AMBULATORY_CARE_PROVIDER_SITE_OTHER): Payer: Medicare Other | Admitting: Family Medicine

## 2021-09-10 ENCOUNTER — Other Ambulatory Visit: Payer: Self-pay

## 2021-09-10 VITALS — BP 130/80 | HR 77 | Temp 97.6°F | Ht 70.0 in | Wt 248.8 lb

## 2021-09-10 DIAGNOSIS — Z0000 Encounter for general adult medical examination without abnormal findings: Secondary | ICD-10-CM | POA: Diagnosis not present

## 2021-09-10 MED ORDER — SILDENAFIL CITRATE 20 MG PO TABS: ORAL_TABLET | ORAL | 11 refills | Status: AC

## 2021-09-10 MED ORDER — NITROGLYCERIN 0.4 MG SL SUBL
SUBLINGUAL_TABLET | SUBLINGUAL | 3 refills | Status: DC
Start: 2021-09-10 — End: 2023-05-13

## 2021-09-10 NOTE — Progress Notes (Signed)
Subjective:  Patient ID: William Strickland, male    DOB: 19-Oct-1955  Age: 66 y.o. MRN: [id number]  CC: Physical  HPI William Strickland is a 66 y.o. male presents to the clinic today for an annual physical.  Patient states that he is doing well.  He has no complaints at this time.  He would like to discuss his cholesterol numbers as well as his PSA.  Preventative Healthcare Colonoscopy: Up to date. Immunizations Tetanus - Up do date. Pneumococcal - Candidate for PCV 19 given age.  Zoster - Prior vaccination with Zostavax. Prostate cancer screening: Up to date. Labs: Recent labs.  Alcohol use: No. Smoking/tobacco use: No.  PMH, Surgical Hx, Family Hx, Social History reviewed and updated as below.  Past Medical History:  Diagnosis Date   CAD (coronary artery disease)    hx CABG 2004, last cath 06/2013 with patent grafts   History of nuclear stress test 05/31/2010; 2013   non-ischemic ; normal perfusion   Hyperlipidemia    Past Surgical History:  Procedure Laterality Date   CARDIAC CATHETERIZATION  06/2013   Patent grafts   COLONOSCOPY N/A 09/03/2016   Procedure: COLONOSCOPY;  Surgeon: [name], MD;  Location: [facility];  Service: Endoscopy;  Laterality: N/A;  9:00 AM   CORONARY ARTERY BYPASS GRAFT  2004   LIMA to LAD, LIMA to OM, vein to a diagonal branch, sequential vein to PDA and PLA   LEFT HEART CATHETERIZATION WITH CORONARY ANGIOGRAM N/A 07/08/2013   Procedure: LEFT HEART CATHETERIZATION WITH CORONARY ANGIOGRAM;  Surgeon: [name], MD;  Location: [facility] CATH LAB;  Service: Cardiovascular;  Laterality: N/A;   SHOULDER ARTHROSCOPY WITH SUBACROMIAL DECOMPRESSION, ROTATOR CUFF REPAIR AND BICEP TENDON REPAIR Left 07/27/2014   Procedure: LEFT SHOULDER ARTHROSCOPY WITH SUBACROMIAL DECOMPRESSION, DISTAL CLAVICLE RESECTION, ROTATOR CUFF REPAIR AND POSSIBLE BICEP TENODESIS;  Surgeon: [name], MD;  Location: [facility] OR;  Service: Orthopedics;  Laterality: Left;    Family History  Problem Relation Age of Onset   Heart attack Mother    Heart disease Mother    Hypertension Mother    Cancer Mother    Heart disease Father    Heart attack Brother 82   CAD Brother    CAD Other    Hyperlipidemia Other    Prostate cancer Brother    Social History   Tobacco Use   Smoking status: Never   Smokeless tobacco: Never  Substance Use Topics   Alcohol use: No    Review of Systems  Constitutional: Negative.   Respiratory: Negative.    Cardiovascular: Negative.    Objective:   Today's Vitals: BP 130/80    Pulse 77    Temp 97.6 F (36.4 C)    Ht 5\' 10"  (1.778 m)    Wt 248 lb 12.8 oz (112.9 kg)    SpO2 95%    BMI 35.70 kg/m   Physical Exam Constitutional:      General: He is not in acute distress.    Appearance: Normal appearance. He is not ill-appearing.  HENT:     Head: Normocephalic and atraumatic.  Eyes:     General:        Right eye: No discharge.        Left eye: No discharge.     Conjunctiva/sclera: Conjunctivae normal.  Cardiovascular:     Rate and Rhythm: Normal rate and regular rhythm.  Pulmonary:     Effort: Pulmonary effort is normal.  Breath sounds: Normal breath sounds. No wheezing, rhonchi or rales.  Abdominal:     General: There is no distension.     Palpations: Abdomen is soft.  Neurological:     Mental Status: He is alert.  Psychiatric:        Mood and Affect: Mood normal.        Behavior: Behavior normal.     Assessment & Plan:   Problem List Items Addressed This Visit       Other   Annual physical exam    Doing well. Advised to consider pneumococcal vaccination.  Patient states that he will consider. Declines Shingrix as he has previously had Zostavax. PSA screening done.  Discussed lab results today. Tetanus up-to-date. Colonoscopy up-to-date.       Meds ordered this encounter  Medications   nitroGLYCERIN (NITROSTAT) 0.4 MG SL tablet    Sig: PLACE ONE TABLET UNDER THE TONGUE IF NEEDED EVERY 5  MINUTES FOR CHEST PAIN    Dispense:  25 tablet    Refill:  3   sildenafil (REVATIO) 20 MG tablet    Sig: Take 3-5 tablets as needed prior to intercourse.    Dispense:  40 tablet    Refill:  11    Follow-up: Annually  Everlene Other DO [facility]

## 2021-09-10 NOTE — Assessment & Plan Note (Signed)
Doing well. Advised to consider pneumococcal vaccination.  Patient states that he will consider. Declines Shingrix as he has previously had Zostavax. PSA screening done.  Discussed lab results today. Tetanus up-to-date. Colonoscopy up-to-date.

## 2021-09-10 NOTE — Patient Instructions (Signed)
Stop the Niacin.  Follow up annually or sooner if needed.  Take care  Dr. Adriana Simas

## 2021-10-08 ENCOUNTER — Ambulatory Visit (INDEPENDENT_AMBULATORY_CARE_PROVIDER_SITE_OTHER): Payer: Medicare Other

## 2021-10-08 ENCOUNTER — Other Ambulatory Visit: Payer: Self-pay

## 2021-10-08 VITALS — BP 132/82 | HR 67 | Ht 70.0 in | Wt 257.4 lb

## 2021-10-08 DIAGNOSIS — Z0000 Encounter for general adult medical examination without abnormal findings: Secondary | ICD-10-CM

## 2021-10-08 DIAGNOSIS — H539 Unspecified visual disturbance: Secondary | ICD-10-CM

## 2021-10-08 DIAGNOSIS — Z0001 Encounter for general adult medical examination with abnormal findings: Secondary | ICD-10-CM | POA: Diagnosis not present

## 2021-10-08 NOTE — Progress Notes (Signed)
? ?Subjective:  ? William Strickland is a 66 y.o. male who presents for an Initial Medicare Annual Wellness Visit. ? ?Review of Systems    ? ?Cardiac Risk Factors include: advanced age (>65men, >40 women);sedentary lifestyle;obesity (BMI >30kg/m2);male gender;dyslipidemia ? ?   ?Objective:  ?  ?Today's Vitals  ? 10/08/21 0902  ?Weight: 257 lb 6.4 oz (116.8 kg)  ?Height: 5\' 10"  (1.778 m)  ? ?Body mass index is 36.93 kg/m?. ? ?Advanced Directives 10/08/2021 09/03/2016 05/22/2016 07/18/2014 07/08/2013  ?Does Patient Have a Medical Advance Directive? Yes Yes No Yes Patient does not have advance directive  ?Type of Advance Directive Living will Living will - William Strickland;Living will -  ? ? ?Current Medications (verified) ?Outpatient Encounter Medications as of 10/08/2021  ?Medication Sig  ? aspirin 81 MG tablet Take 81 mg by mouth daily.  ? atorvastatin (LIPITOR) 80 MG tablet Take 1 tablet (80 mg total) by mouth daily.  ? metoprolol tartrate (LOPRESSOR) 50 MG tablet Take 1 tablet (50 mg total) by mouth 2 (two) times daily.  ? nitroGLYCERIN (NITROSTAT) 0.4 MG SL tablet PLACE ONE TABLET UNDER THE TONGUE IF NEEDED EVERY 5 MINUTES FOR CHEST PAIN  ? sildenafil (REVATIO) 20 MG tablet Take 3-5 tablets as needed prior to intercourse.  ? ?No facility-administered encounter medications on file as of 10/08/2021.  ? ? ?Allergies (verified) ?Patient has no known allergies.  ? ?History: ?Past Medical History:  ?Diagnosis Date  ? CAD (coronary artery disease)   ? hx CABG 2004, last cath 06/2013 with patent grafts  ? History of nuclear stress test 05/31/2010; 2013  ? non-ischemic ; normal perfusion  ? Hyperlipidemia   ? ?Past Surgical History:  ?Procedure Laterality Date  ? CARDIAC CATHETERIZATION  06/2013  ? Patent grafts  ? COLONOSCOPY N/A 09/03/2016  ? Procedure: COLONOSCOPY;  Surgeon: Daneil Dolin, MD;  Location: [facility];  Service: Endoscopy;  Laterality: N/A;  9:00 AM  ? CORONARY ARTERY BYPASS GRAFT  2004  ? LIMA to  LAD, LIMA to OM, vein to a diagonal branch, sequential vein to PDA and PLA  ? LEFT HEART CATHETERIZATION WITH CORONARY ANGIOGRAM N/A 07/08/2013  ? Procedure: LEFT HEART CATHETERIZATION WITH CORONARY ANGIOGRAM;  Surgeon: Pixie Casino, MD;  Location: [facility] CATH LAB;  Service: Cardiovascular;  Laterality: N/A;  ? SHOULDER ARTHROSCOPY WITH SUBACROMIAL DECOMPRESSION, ROTATOR CUFF REPAIR AND BICEP TENDON REPAIR Left 07/27/2014  ? Procedure: LEFT SHOULDER ARTHROSCOPY WITH SUBACROMIAL DECOMPRESSION, DISTAL CLAVICLE RESECTION, ROTATOR CUFF REPAIR AND POSSIBLE BICEP TENODESIS;  Surgeon: Marin Shutter, MD;  Location: [place];  Service: Orthopedics;  Laterality: Left;  ? ?Family History  ?Problem Relation Age of Onset  ? Heart attack Mother   ? Heart disease Mother   ? Hypertension Mother   ? Cancer Mother   ? Heart disease Father   ? Heart attack Brother 29  ? CAD Brother   ? CAD Other   ? Hyperlipidemia Other   ? Prostate cancer Brother   ? ?Social History  ? ?Socioeconomic History  ? Marital status: Married  ?  Spouse name: William Strickland  ? Number of children: 3  ? Years of education: Not on file  ? Highest education level: Not on file  ?Occupational History  ? Not on file  ?Tobacco Use  ? Smoking status: Never  ? Smokeless tobacco: Never  ?Substance and Sexual Activity  ? Alcohol use: No  ? Drug use: No  ? Sexual activity: Yes  ?  Partners: Male  ?Other Topics Concern  ? Not on file  ?Social History Narrative  ? ** Merged History Encounter **   ?   ? Re-married 2018. Widowed by first wife.   ? 3 daughters.   ? ?Social Determinants of Health  ? ?Financial Resource Strain: Low Risk   ? Difficulty of Paying Living Expenses: Not hard at all  ?Food Insecurity: No Food Insecurity  ? Worried About Charity fundraiser in the Last Year: Never true  ? Ran Out of Food in the Last Year: Never true  ?Transportation Needs: No Transportation Needs  ? Lack of Transportation (Medical): No  ? Lack of Transportation (Non-Medical): No  ?Physical  Activity: Inactive  ? Days of Exercise per Week: 0 days  ? Minutes of Exercise per Session: 0 min  ?Stress: No Stress Concern Present  ? Feeling of Stress : Not at all  ?Social Connections: Socially Integrated  ? Frequency of Communication with Friends and Family: More than three times a week  ? Frequency of Social Gatherings with Friends and Family: More than three times a week  ? Attends Religious Services: More than 4 times per year  ? Active Member of Clubs or Organizations: Yes  ? Attends Archivist Meetings: More than 4 times per year  ? Marital Status: Married  ? ? ?Tobacco Counseling ?Counseling given: Not Answered ? ? ?Clinical Intake: ? ?Pre-visit preparation completed: Yes ? ?Pain : No/denies pain ? ?  ? ?BMI - recorded: 36.93 ?Nutritional Status: BMI > 30  Obese ?Nutritional Risks: None ?Diabetes: No ? ?How often do you need to have someone help you when you read instructions, pamphlets, or other written materials from your doctor or pharmacy?: 1 - Never ? ?Diabetic?No ? ?Interpreter Needed?: No ? ?Information entered by :: mj Gilberto Stanforth, lpn ? ? ?Activities of Daily Living ?In your present state of health, do you have any difficulty performing the following activities: 10/08/2021  ?Hearing? Y  ?Vision? N  ?Difficulty concentrating or making decisions? Y  ?Comment Memory at times.  ?Walking or climbing stairs? N  ?Dressing or bathing? N  ?Doing errands, shopping? N  ?Preparing Food and eating ? N  ?Using the Toilet? N  ?In the past six months, have you accidently leaked urine? N  ?Do you have problems with loss of bowel control? N  ?Managing your Medications? N  ?Managing your Finances? N  ?Housekeeping or managing your Housekeeping? N  ?Some recent data might be hidden  ? ? ?Patient Care Team: ?Coral Spikes, DO as PCP - General (Family Medicine) ?Lorretta Harp, MD as PCP - Cardiology (Cardiology) ? ?Indicate any recent Medical Services you may have received from other than [facility] providers in  the past year (date may be approximate). ? ?   ?Assessment:  ? This is a routine wellness examination for William Strickland. ? ?Hearing/Vision screen ?Hearing Screening - Comments:: Some hearing issues.  ?Vision Screening - Comments:: Readers. Would like referral to Optometrist.  ? ?Dietary issues and exercise activities discussed: ?Current Exercise Habits: The patient does not participate in regular exercise at present, Exercise limited by: cardiac condition(s) ? ? Goals Addressed   ? ?  ?  ?  ?  ? This Visit's Progress  ?  Weight (lb) < 200 lb (90.7 kg)   257 lb 6.4 oz (116.8 kg)  ?  Increase exercise.  ?  ? ?  ? ?Depression Screen ?PHQ 2/9 Scores 10/08/2021 09/10/2021 10/18/2019 03/16/2018 03/02/2017  ?  PHQ - 2 Score 0 0 0 0 0  ?  ?Fall Risk ?Fall Risk  10/08/2021 09/10/2021 10/18/2019 03/16/2018 03/02/2017  ?Falls in the past year? 0 0 0 No No  ?Number falls in past yr: 0 0 0 - -  ?Injury with Fall? 0 0 0 - -  ?Risk for fall due to : No Fall Risks No Fall Risks - - -  ?Follow up Falls prevention discussed Falls evaluation completed Falls evaluation completed - -  ? ? ?FALL RISK PREVENTION PERTAINING TO THE HOME: ? ?Any stairs in or around the home? Yes  ?If so, are there any without handrails? No  ?Home free of loose throw rugs in walkways, pet beds, electrical cords, etc? Yes  ?Adequate lighting in your home to reduce risk of falls? Yes  ? ?ASSISTIVE DEVICES UTILIZED TO PREVENT FALLS: ? ?Life alert? No  ?Use of a cane, walker or w/c? No  ?Grab bars in the bathroom? No  ?Shower chair or bench in shower? Yes  ?Elevated toilet seat or a handicapped toilet? No  ? ?TIMED UP AND GO: ? ?Was the test performed? Yes .  ?Length of time to ambulate 10 feet: 10 sec.  ? ?Gait steady and fast without use of assistive device ? ?Cognitive Function: ?  ?  ?6CIT Screen 10/08/2021  ?What Year? 0 points  ?What month? 0 points  ?What time? 0 points  ?Count back from 20 0 points  ?Months in reverse 0 points  ?Repeat phrase 4 points  ?Total Score 4   ? ? ?Immunizations ?Immunization History  ?Administered Date(s) Administered  ? Influenza,inj,Quad PF,6+ Mos 07/09/2013, 05/29/2017, 05/12/2018  ? Influenza-Unspecified 05/29/2017, 06/12/2019  ? Moderna Sars-Covi

## 2021-10-08 NOTE — Patient Instructions (Signed)
William Strickland , ?Thank you for taking time to come for your Medicare Wellness Visit. I appreciate your ongoing commitment to your health goals. Please review the following plan we discussed and let me know if I can assist you in the future.  ? ?Screening recommendations/referrals: ?Colonoscopy: Done 09/03/2016 Repeat in 10 years ? ?Recommended yearly ophthalmology/optometry visit for glaucoma screening and checkup ?Recommended yearly dental visit for hygiene and checkup ? ?Vaccinations: ?Influenza vaccine: Due Repeat annually ? ?Pneumococcal vaccine: Discussed. ?Tdap vaccine: Done 03/16/2018 Repeat in 10 years ? ?Shingles vaccine: Done 10/26/2010 Discussed shingles    ?Covid-19: Done 10/14/2019 and 11/15/2019 ? ?Advanced directives: Please bring a copy of your health care power of attorney and living will to the office to be added to your chart at your convenience. ? ? ?Conditions/risks identified: Aim for 30 minutes of exercise or brisk walking, 6-8 glasses of water, and 5 servings of fruits and vegetables each day. ? ? ?Next appointment: Follow up in one year for your annual wellness visit. 2024. ? ?Preventive Care 39 Years and Older, Male ? ?Preventive care refers to lifestyle choices and visits with your health care provider that can promote health and wellness. ?What does preventive care include? ?A yearly physical exam. This is also called an annual well check. ?Dental exams once or twice a year. ?Routine eye exams. Ask your health care provider how often you should have your eyes checked. ?Personal lifestyle choices, including: ?Daily care of your teeth and gums. ?Regular physical activity. ?Eating a healthy diet. ?Avoiding tobacco and drug use. ?Limiting alcohol use. ?Practicing safe sex. ?Taking low doses of aspirin every day. ?Taking vitamin and mineral supplements as recommended by your health care provider. ?What happens during an annual well check? ?The services and screenings done by your health care provider  during your annual well check will depend on your age, overall health, lifestyle risk factors, and family history of disease. ?Counseling  ?Your health care provider may ask you questions about your: ?Alcohol use. ?Tobacco use. ?Drug use. ?Emotional well-being. ?Home and relationship well-being. ?Sexual activity. ?Eating habits. ?History of falls. ?Memory and ability to understand (cognition). ?Work and work Statistician. ?Screening  ?You may have the following tests or measurements: ?Height, weight, and BMI. ?Blood pressure. ?Lipid and cholesterol levels. These may be checked every 5 years, or more frequently if you are over 86 years old. ?Skin check. ?Lung cancer screening. You may have this screening every year starting at age 36 if you have a 30-pack-year history of smoking and currently smoke or have quit within the past 15 years. ?Fecal occult blood test (FOBT) of the stool. You may have this test every year starting at age 23. ?Flexible sigmoidoscopy or colonoscopy. You may have a sigmoidoscopy every 5 years or a colonoscopy every 10 years starting at age 33. ?Prostate cancer screening. Recommendations will vary depending on your family history and other risks. ?Hepatitis C blood test. ?Hepatitis B blood test. ?Sexually transmitted disease (STD) testing. ?Diabetes screening. This is done by checking your blood sugar (glucose) after you have not eaten for a while (fasting). You may have this done every 1-3 years. ?Abdominal aortic aneurysm (AAA) screening. You may need this if you are a current or former smoker. ?Osteoporosis. You may be screened starting at age 7 if you are at high risk. ?Talk with your health care provider about your test results, treatment options, and if necessary, the need for more tests. ?Vaccines  ?Your health care provider may recommend certain  vaccines, such as: ?Influenza vaccine. This is recommended every year. ?Tetanus, diphtheria, and acellular pertussis (Tdap, Td) vaccine. You  may need a Td booster every 10 years. ?Zoster vaccine. You may need this after age 61. ?Pneumococcal 13-valent conjugate (PCV13) vaccine. One dose is recommended after age 1. ?Pneumococcal polysaccharide (PPSV23) vaccine. One dose is recommended after age 61. ?Talk to your health care provider about which screenings and vaccines you need and how often you need them. ?This information is not intended to replace advice given to you by your health care provider. Make sure you discuss any questions you have with your health care provider. ?Document Released: 08/10/2015 Document Revised: 04/02/2016 Document Reviewed: 05/15/2015 ?Elsevier Interactive Patient Education ? 2017 [place]. ? ?Fall Prevention in the Home ?Falls can cause injuries. They can happen to people of all ages. There are many things you can do to make your home safe and to help prevent falls. ?What can I do on the outside of my home? ?Regularly fix the edges of walkways and driveways and fix any cracks. ?Remove anything that might make you trip as you walk through a door, such as a raised step or threshold. ?Trim any bushes or trees on the path to your home. ?Use bright outdoor lighting. ?Clear any walking paths of anything that might make someone trip, such as rocks or tools. ?Regularly check to see if handrails are loose or broken. Make sure that both sides of any steps have handrails. ?Any raised decks and porches should have guardrails on the edges. ?Have any leaves, snow, or ice cleared regularly. ?Use sand or salt on walking paths during winter. ?Clean up any spills in your garage right away. This includes oil or grease spills. ?What can I do in the bathroom? ?Use night lights. ?Install grab bars by the toilet and in the tub and shower. Do not use towel bars as grab bars. ?Use non-skid mats or decals in the tub or shower. ?If you need to sit down in the shower, use a plastic, non-slip stool. ?Keep the floor dry. Clean up any water that spills  on the floor as soon as it happens. ?Remove soap buildup in the tub or shower regularly. ?Attach bath mats securely with double-sided non-slip rug tape. ?Do not have throw rugs and other things on the floor that can make you trip. ?What can I do in the bedroom? ?Use night lights. ?Make sure that you have a light by your bed that is easy to reach. ?Do not use any sheets or blankets that are too big for your bed. They should not hang down onto the floor. ?Have a firm chair that has side arms. You can use this for support while you get dressed. ?Do not have throw rugs and other things on the floor that can make you trip. ?What can I do in the kitchen? ?Clean up any spills right away. ?Avoid walking on wet floors. ?Keep items that you use a lot in easy-to-reach places. ?If you need to reach something above you, use a strong step stool that has a grab bar. ?Keep electrical cords out of the way. ?Do not use floor polish or wax that makes floors slippery. If you must use wax, use non-skid floor wax. ?Do not have throw rugs and other things on the floor that can make you trip. ?What can I do with my stairs? ?Do not leave any items on the stairs. ?Make sure that there are handrails on both sides of the stairs  and use them. Fix handrails that are broken or loose. Make sure that handrails are as long as the stairways. ?Check any carpeting to make sure that it is firmly attached to the stairs. Fix any carpet that is loose or worn. ?Avoid having throw rugs at the top or bottom of the stairs. If you do have throw rugs, attach them to the floor with carpet tape. ?Make sure that you have a light switch at the top of the stairs and the bottom of the stairs. If you do not have them, ask someone to add them for you. ?What else can I do to help prevent falls? ?Wear shoes that: ?Do not have high heels. ?Have rubber bottoms. ?Are comfortable and fit you well. ?Are closed at the toe. Do not wear sandals. ?If you use a stepladder: ?Make  sure that it is fully opened. Do not climb a closed stepladder. ?Make sure that both sides of the stepladder are locked into place. ?Ask someone to hold it for you, if possible. ?Clearly mark and make s

## 2021-11-09 ENCOUNTER — Other Ambulatory Visit: Payer: Self-pay | Admitting: Cardiovascular Disease

## 2021-11-11 ENCOUNTER — Other Ambulatory Visit: Payer: Self-pay | Admitting: Cardiovascular Disease

## 2021-11-15 ENCOUNTER — Encounter: Payer: Self-pay | Admitting: Cardiovascular Disease

## 2021-11-15 ENCOUNTER — Ambulatory Visit: Payer: Medicare Other | Admitting: Cardiovascular Disease

## 2021-11-15 VITALS — BP 124/76 | HR 68 | Ht 72.0 in | Wt 251.0 lb

## 2021-11-15 DIAGNOSIS — I2510 Atherosclerotic heart disease of native coronary artery without angina pectoris: Secondary | ICD-10-CM | POA: Diagnosis not present

## 2021-11-15 DIAGNOSIS — E782 Mixed hyperlipidemia: Secondary | ICD-10-CM

## 2021-11-15 MED ORDER — METOPROLOL TARTRATE 50 MG PO TABS: 50.0000 mg | ORAL_TABLET | Freq: Two times a day (BID) | ORAL | 3 refills | Status: DC

## 2021-11-15 NOTE — Assessment & Plan Note (Signed)
History of hyperlipidemia on statin therapy with lipid profile performed 09/06/2021 revealing total cholesterol 97, LDL 46 and HDL at 35.  I have told him that he can discontinue his niacin. ?

## 2021-11-15 NOTE — Progress Notes (Signed)
? ? ? ?11/15/2021 ?William Strickland   ?[birth year]  ?[id number] ? ?Primary Physician Tommie Sams, DO ?Primary Cardiologist: [name] MD Nicholes Cal[place], [place] ? ?HPI:  William Strickland is a 66 y.o.  mildly overweight widowed although remarried 3 years ago Caucasian male father of 3 children, grandfather to 6 grandchildren who is accompanied by his wife  [name] . I last saw in the office  10/05/2020.  He has a history of CAD status post coronary artery bypass grafting in 2004 with an LIMA graft to the LAD, RIMA graft to an OM branch, a vein to diagonal branch, and a sequential vein to the PDA/PL A. His other problems include hyperlipidemia. His recent lipid profile performed 09/03/2017 revealed an LDL of  47 and an HDL of 35.  He had a nonischemic Myoview  06/04/12, as well as 09/21/2020. ?  ?Since I saw him a year ago he continues to do well.  He denies chest pain or shortness of breath.  Does get occasional palpitations.  He had an event monitor performed 10/05/2020 that showed occasional PACs, PVCs and short runs of PSVT and nonsustained ventricular tachycardia. ? ? ?Current Meds  ?Medication Sig  ? aspirin 81 MG tablet Take 81 mg by mouth daily.  ? atorvastatin (LIPITOR) 80 MG tablet TAKE 1 TABLET(80 MG) BY MOUTH DAILY  ? nitroGLYCERIN (NITROSTAT) 0.4 MG SL tablet PLACE ONE TABLET UNDER THE TONGUE IF NEEDED EVERY 5 MINUTES FOR CHEST PAIN  ? sildenafil (REVATIO) 20 MG tablet Take 3-5 tablets as needed prior to intercourse.  ? [DISCONTINUED] metoprolol tartrate (LOPRESSOR) 50 MG tablet TAKE A 1/2 TABLET BY MOUTH 2 TIMES DAILY  ?  ? ?No Known Allergies ? ?Social History  ? ?Socioeconomic History  ? Marital status: Married  ?  Spouse name: [name]  ? Number of children: 3  ? Years of education: Not on file  ? Highest education level: Not on file  ?Occupational History  ? Not on file  ?Tobacco Use  ? Smoking status: Never  ? Smokeless tobacco: Never  ?Substance and Sexual Activity  ? Alcohol use: No  ? Drug use:  No  ? Sexual activity: Yes  ?  Partners: Male  ?Other Topics Concern  ? Not on file  ?Social History Narrative  ? ** Merged History Encounter **   ?   ? Re-married 2018. Widowed by first wife.   ? 3 daughters.   ? ?Social Determinants of Health  ? ?Financial Resource Strain: Low Risk   ? Difficulty of Paying Living Expenses: Not hard at all  ?Food Insecurity: No Food Insecurity  ? Worried About Programme researcher, broadcasting/film/video in the Last Year: Never true  ? Ran Out of Food in the Last Year: Never true  ?Transportation Needs: No Transportation Needs  ? Lack of Transportation (Medical): No  ? Lack of Transportation (Non-Medical): No  ?Physical Activity: Inactive  ? Days of Exercise per Week: 0 days  ? Minutes of Exercise per Session: 0 min  ?Stress: No Stress Concern Present  ? Feeling of Stress : Not at all  ?Social Connections: Socially Integrated  ? Frequency of Communication with Friends and Family: More than three times a week  ? Frequency of Social Gatherings with Friends and Family: More than three times a week  ? Attends Religious Services: More than 4 times per year  ? Active Member of Clubs or Organizations: Yes  ? Attends Banker Meetings: More than 4  times per year  ? Marital Status: Married  ?Intimate Partner Violence: Not At Risk  ? Fear of Current or Ex-Partner: No  ? Emotionally Abused: No  ? Physically Abused: No  ? Sexually Abused: No  ?  ? ?Review of Systems: ?General: negative for chills, fever, night sweats or weight changes.  ?Cardiovascular: negative for chest pain, dyspnea on exertion, edema, orthopnea, palpitations, paroxysmal nocturnal dyspnea or shortness of breath ?Dermatological: negative for rash ?Respiratory: negative for cough or wheezing ?Urologic: negative for hematuria ?Abdominal: negative for nausea, vomiting, diarrhea, bright red blood per rectum, melena, or hematemesis ?Neurologic: negative for visual changes, syncope, or dizziness ?All other systems reviewed and are otherwise  negative except as noted above. ? ? ? ?Blood pressure 124/76, pulse 68, height 6' (1.829 m), weight 251 lb (113.9 kg), SpO2 96 %.  ?General appearance: alert and no distress ?Neck: no adenopathy, no carotid bruit, no JVD, supple, symmetrical, trachea midline, and thyroid not enlarged, symmetric, no tenderness/mass/nodules ?Lungs: clear to auscultation bilaterally ?Heart: regular rate and rhythm, S1, S2 normal, no murmur, click, rub or gallop ?Extremities: extremities normal, atraumatic, no cyanosis or edema ?Pulses: 2+ and symmetric ?Skin: Skin color, texture, turgor normal. No rashes or lesions ?Neurologic: Grossly normal ? ?EKG sinus rhythm at 68 without ST or T wave changes.  Personally reviewed this EKG. ? ?ASSESSMENT AND PLAN:  ? ?Coronary artery disease, 2004 CABG, cath 07/08/13 with patent grafts ?History of CAD status post bypass grafting in 2004 after cath which I performed.  He had a LIMA to his LAD, RIMA to the OM and a vein to diagonal branch as well as sequential vein to the PDA and PLA.  He had a cath performed 07/08/2013 that revealed patent grafts.  Recent Myoview stress test performed 09/21/2020 was nonischemic.  He denies chest pain. ? ?Hyperlipidemia ?History of hyperlipidemia on statin therapy with lipid profile performed 09/06/2021 revealing total cholesterol 97, LDL 46 and HDL at 35.  I have told him that he can discontinue his niacin. ? ? ? ? ?[name] MD FACP,FACC,FAHA, FSCAI ?11/15/2021 ?10:38 AM ?

## 2021-11-15 NOTE — Patient Instructions (Signed)
Medication Instructions:  ? ?-Stop niacin. ? ?-Increase metoprolol tartrate (lopressor) to 50mg  twice daily. ? ? ?*If you need a refill on your cardiac medications before your next appointment, please call your pharmacy* ? ? ?Follow-Up: ?At [facility], you and your health needs are our priority.  As part of our continuing mission to provide you with exceptional heart care, we have created designated Provider Care Teams.  These Care Teams include your primary Cardiologist (physician) and Advanced Practice Providers (APPs -  Physician Assistants and Nurse Practitioners) who all work together to provide you with the care you need, when you need it. ? ?We recommend signing up for the patient portal called "MyChart".  Sign up information is provided on this After Visit Summary.  MyChart is used to connect with patients for Virtual Visits (Telemedicine).  Patients are able to view lab/test results, encounter notes, upcoming appointments, etc.  Non-urgent messages can be sent to your provider as well.   ?To learn more about what you can do with MyChart, go to [facility].   ? ?Your next appointment:   ?12 month(s) ? ?The format for your next appointment:   ?In Person ? ?Provider:   ?[email or web address], MD  ?

## 2021-11-15 NOTE — Assessment & Plan Note (Signed)
History of CAD status post bypass grafting in 2004 after cath which I performed.  He had a LIMA to his LAD, RIMA to the OM and a vein to diagonal branch as well as sequential vein to the PDA and PLA.  He had a cath performed 07/08/2013 that revealed patent grafts.  Recent Myoview stress test performed 09/21/2020 was nonischemic.  He denies chest pain. ?

## 2022-02-02 ENCOUNTER — Other Ambulatory Visit: Payer: Self-pay | Admitting: Cardiovascular Disease

## 2022-02-05 IMAGING — US US BREAST*R* LIMITED INC AXILLA
1 series · 5 of 5 positions shown · non-contrast
Comparison: None.

ACR Breast Density Category a: The breast tissue is almost entirely
fatty.

CLINICAL DATA: 65-year-old male presenting with a possible palpable
lump in the inner RIGHT breast. This is the patient's initial
mammogram.

EXAM:
DIGITAL DIAGNOSTIC BILATERAL MAMMOGRAM WITH TOMOSYNTHESIS AND CAD;
ULTRASOUND RIGHT BREAST LIMITED
TECHNIQUE: Bilateral digital diagnostic mammography and breast tomosynthesis
was performed. The images were evaluated with computer-aided
detection.; Targeted ultrasound examination of the right breast was
performed

[Series 1: us breast*right* limited inc axilla · 0.05mm/px · 5 of 5 slices shown]
[im 1/5]
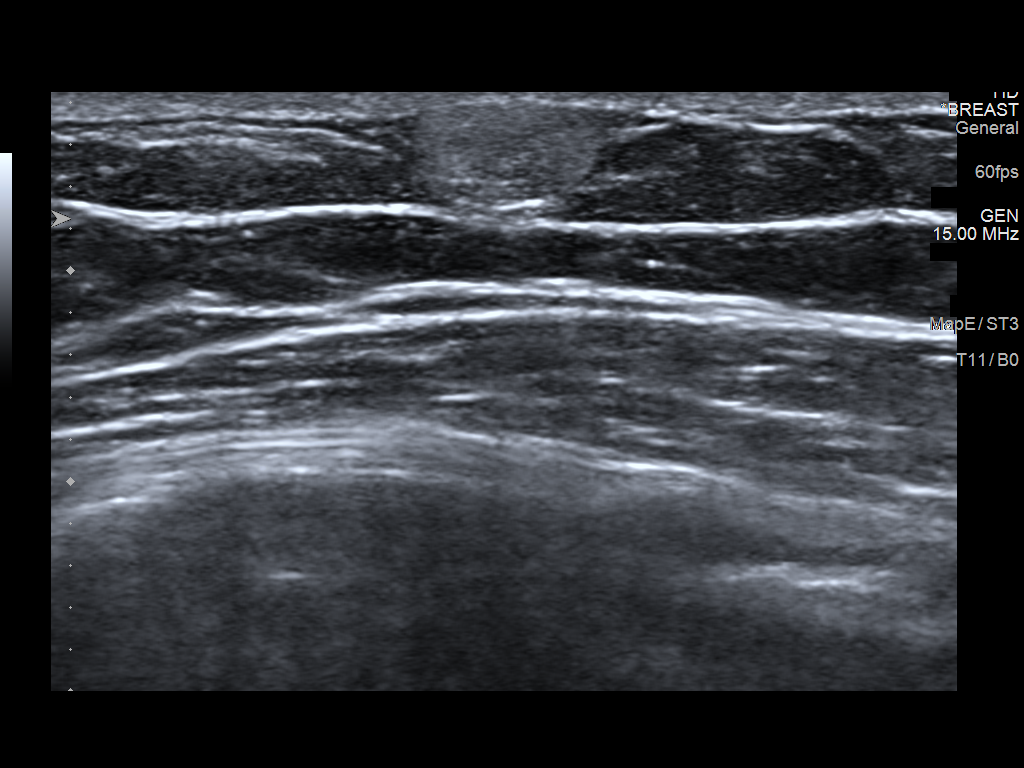
[im 2/5]
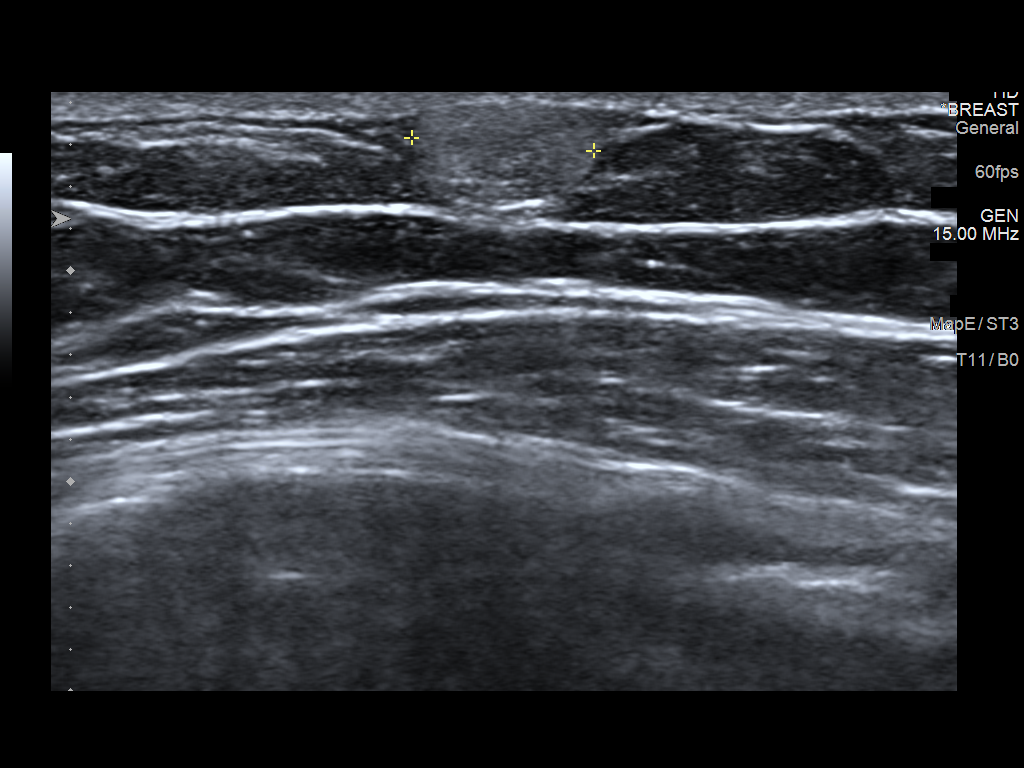
[im 3/5]
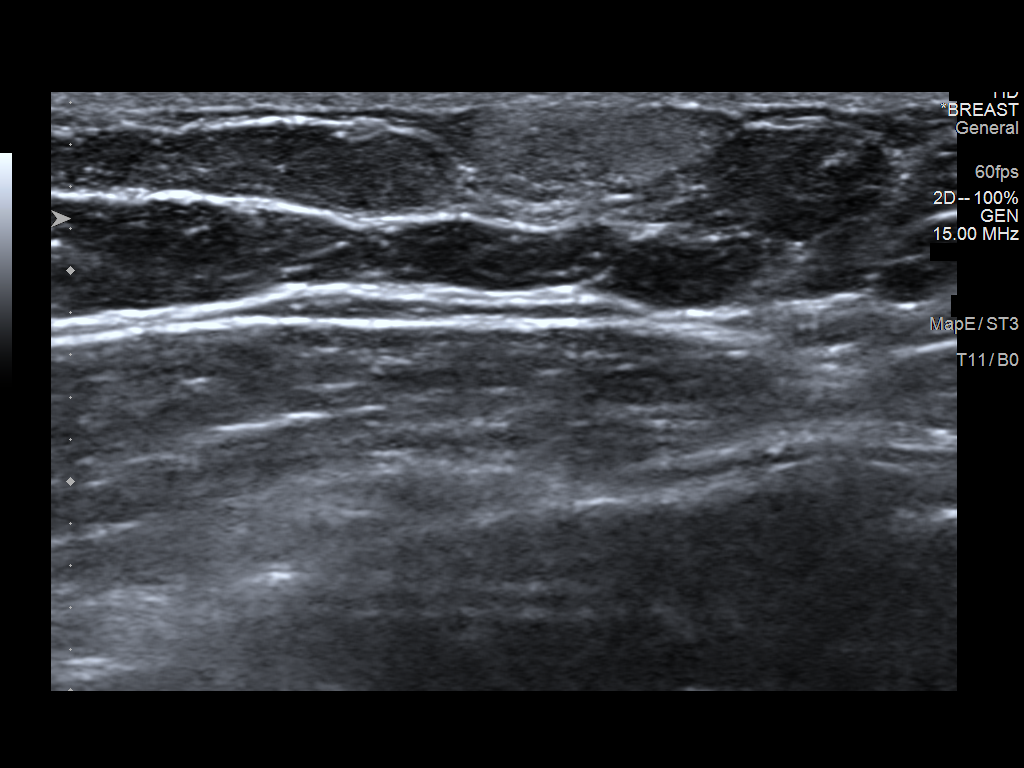
[im 4/5]
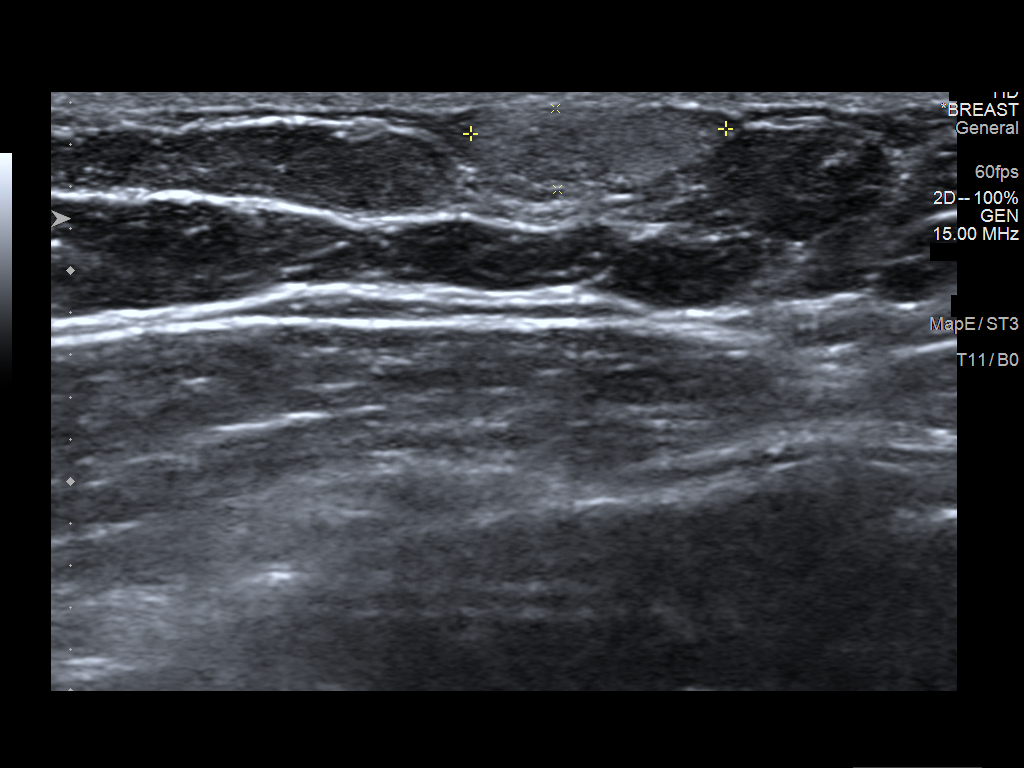
[im 5/5]
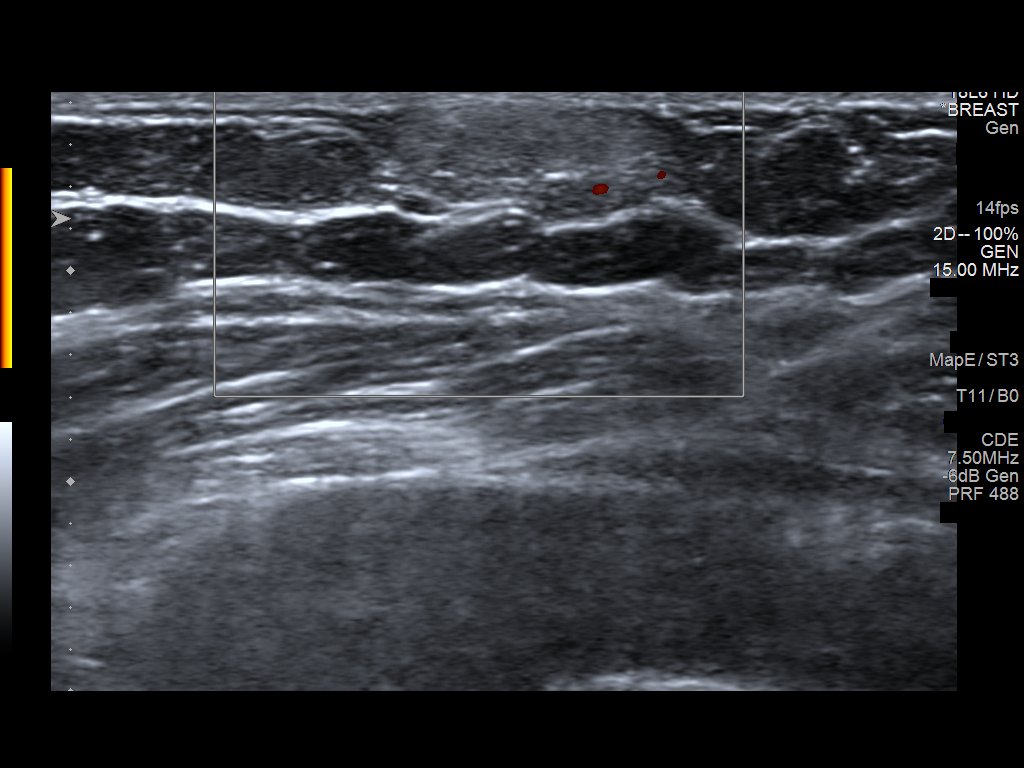

[5 of 5 positions shown; findings below may reference images not displayed]

FINDINGS: Full field CC and MLO views of both breasts and a spot tangential
view of the palpable concern in the RIGHT breast were obtained.

RIGHT: Subtle focal asymmetry with associated fat in the inner
breast at the site of palpable concern. Minimal gynecomastia.

Targeted ultrasound is performed in the area of palpable concern,
demonstrating a circumscribed hyperechoic mass within a subcutaneous
fat lobule at the 3 o'clock position 6 cm from the nipple, measuring
approximately 1.2 x 0.4 x 0.9 cm, demonstrating no posterior
characteristics and peripheral power Doppler flow. No suspicious
solid mass or abnormal acoustic shadowing is identified.

LEFT: No findings suspicious for malignancy.  Minimal gynecomastia.

On correlative physical examination, there is a mobile palpable
superficial 1 cm mass in the inner RIGHT breast corresponding to
what the patient is feeling.
IMPRESSION: 1. No mammographic or sonographic evidence of malignancy involving
the RIGHT breast.
2. No mammographic evidence of malignancy involving the LEFT breast.
3. Benign 1.2 cm lipoma versus fat necrosis involving the inner LEFT
breast which accounts for the palpable concern.
4. Symmetric minimal BILATERAL gynecomastia.

RECOMMENDATION:
No further imaging follow-up is felt necessary unless there are
persistent or subsequent clinical concerns.

I have discussed the findings and recommendations with the patient.

BI-RADS CATEGORY  2: Benign.

## 2022-02-05 IMAGING — MG DIGITAL DIAGNOSTIC BILAT W/ TOMO W/ CAD
6 of 12 series · 6 of 36 positions shown · non-contrast
Comparison: None.

ACR Breast Density Category a: The breast tissue is almost entirely
fatty.

CLINICAL DATA: 65-year-old male presenting with a possible palpable
lump in the inner RIGHT breast. This is the patient's initial
mammogram.

EXAM:
DIGITAL DIAGNOSTIC BILATERAL MAMMOGRAM WITH TOMOSYNTHESIS AND CAD;
ULTRASOUND RIGHT BREAST LIMITED
TECHNIQUE: Bilateral digital diagnostic mammography and breast tomosynthesis
was performed. The images were evaluated with computer-aided
detection.; Targeted ultrasound examination of the right breast was
performed

[L MLO synth-2D]
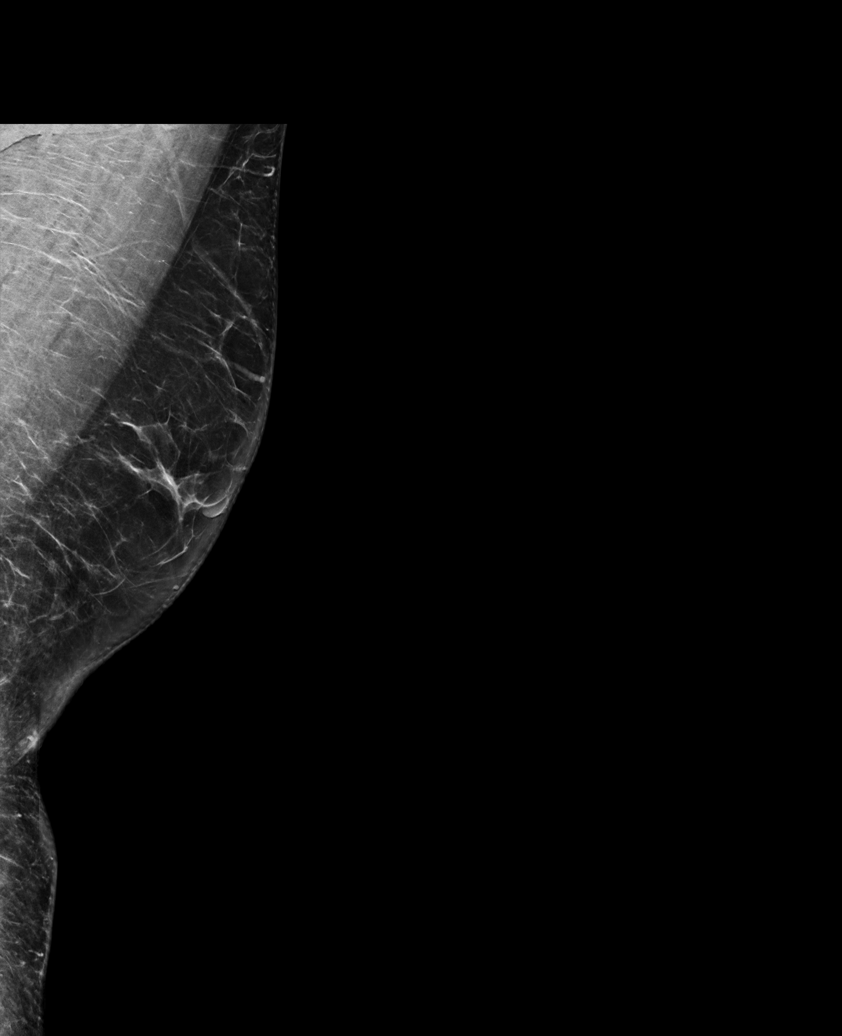

[R MLO synth-2D (1 of 2)]
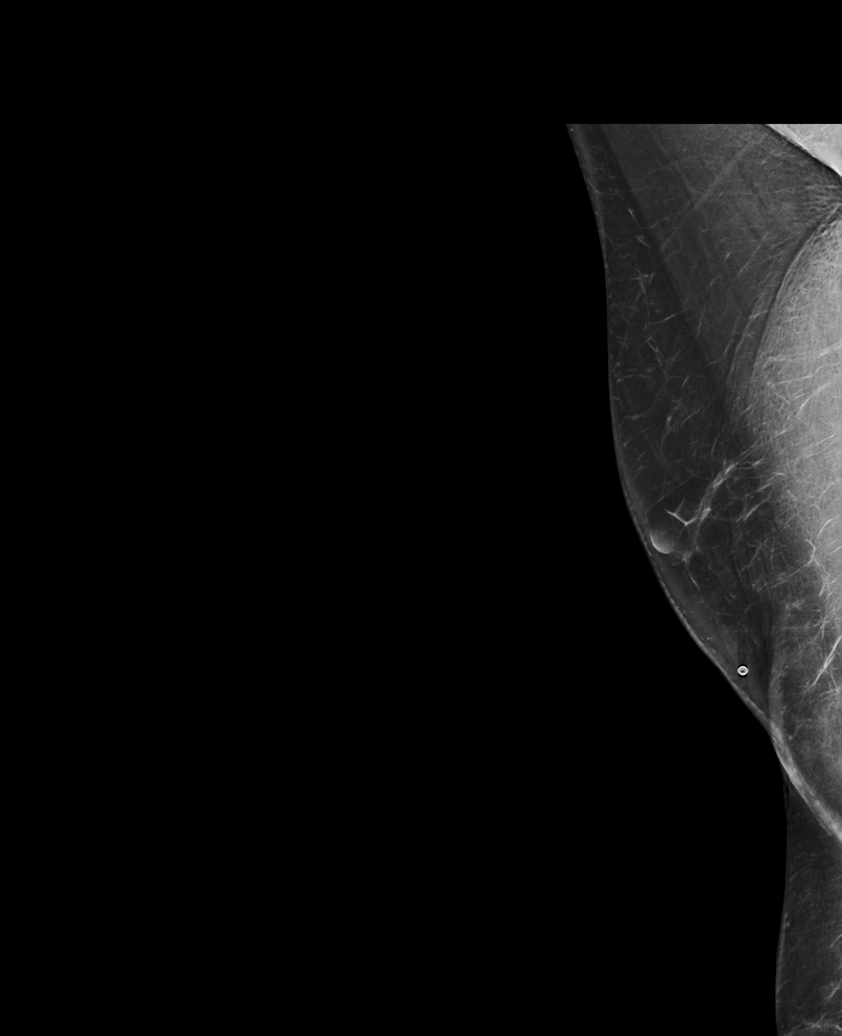

[R MLO synth-2D (2 of 2)]
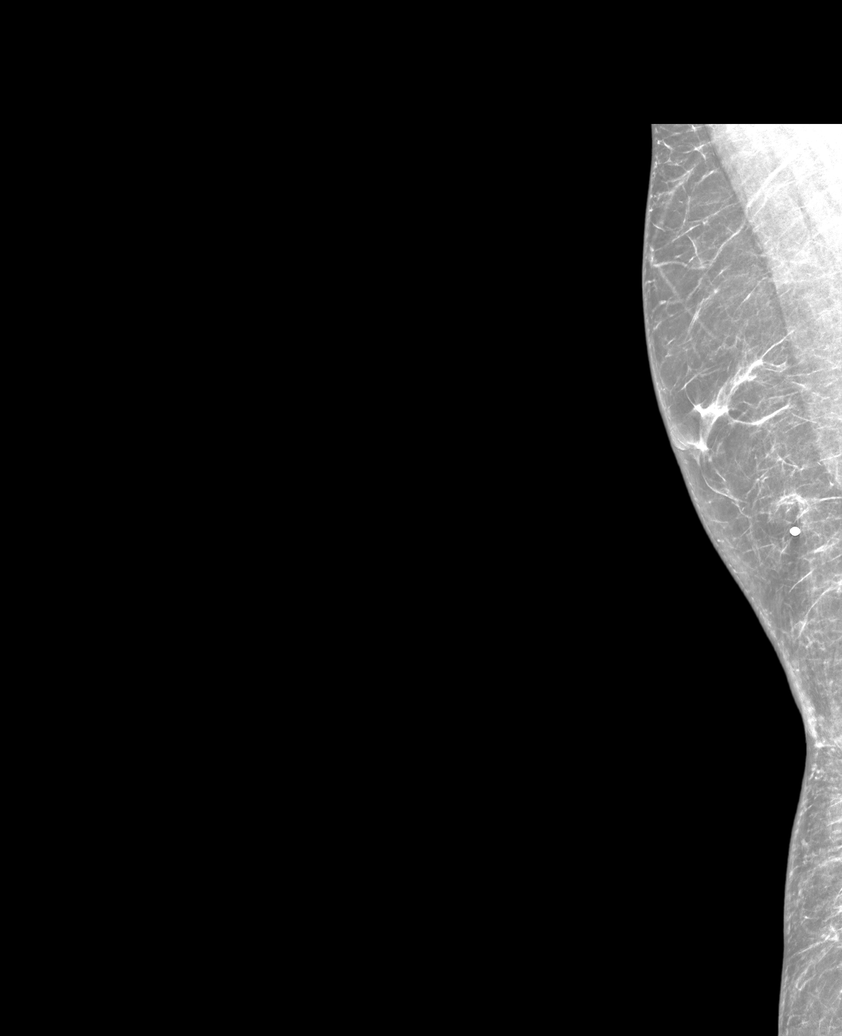

[R CC synth-2D (1 of 2)]
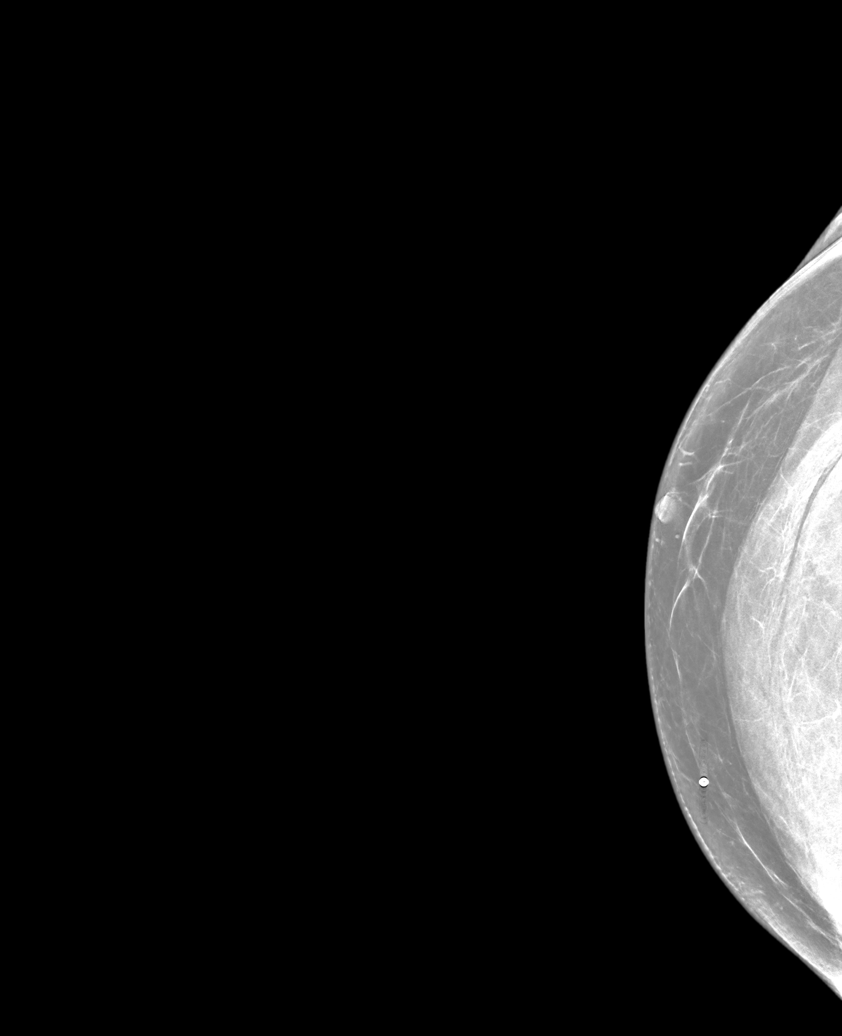

[L CC synth-2D]
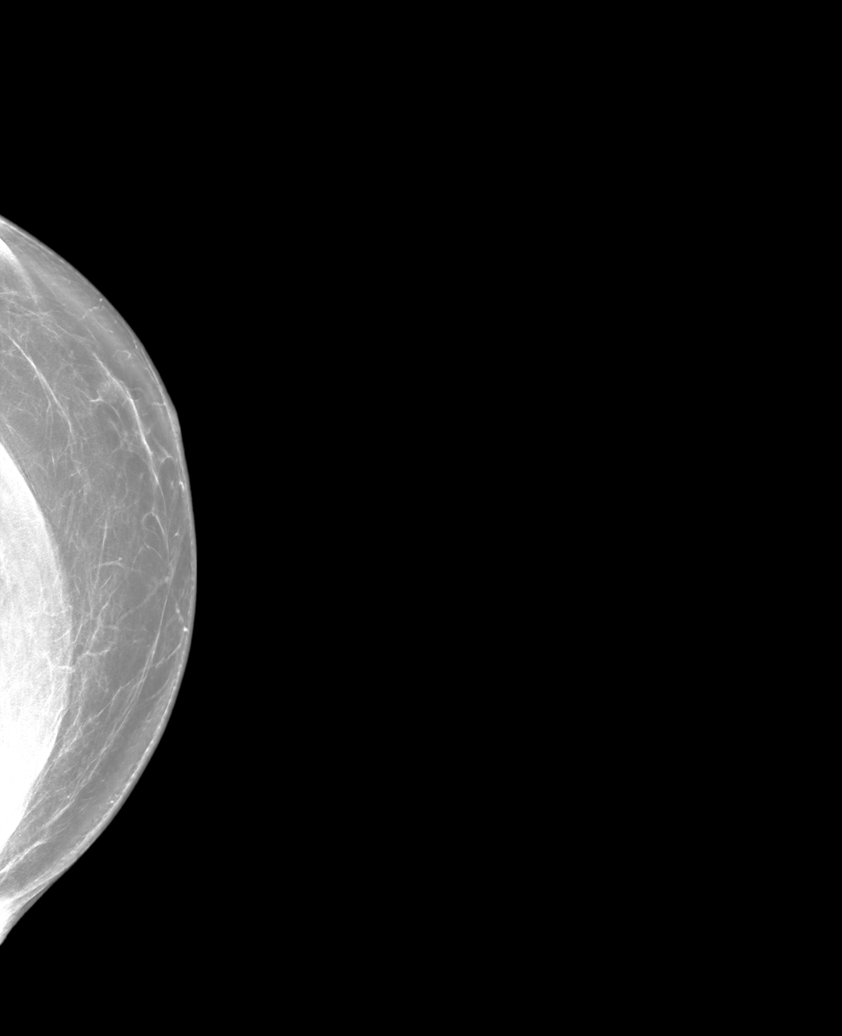

[R CC synth-2D (2 of 2)]
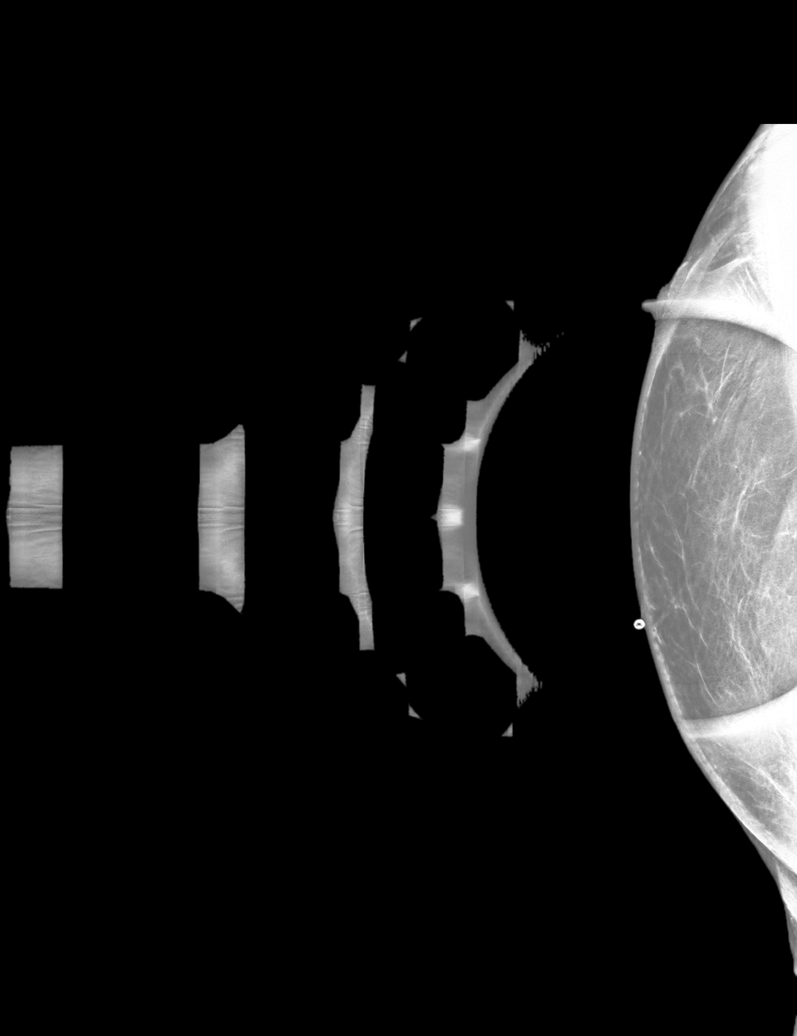

[6 of 36 positions shown; findings below may reference images not displayed]

FINDINGS: Full field CC and MLO views of both breasts and a spot tangential
view of the palpable concern in the RIGHT breast were obtained.

RIGHT: Subtle focal asymmetry with associated fat in the inner
breast at the site of palpable concern. Minimal gynecomastia.

Targeted ultrasound is performed in the area of palpable concern,
demonstrating a circumscribed hyperechoic mass within a subcutaneous
fat lobule at the 3 o'clock position 6 cm from the nipple, measuring
approximately 1.2 x 0.4 x 0.9 cm, demonstrating no posterior
characteristics and peripheral power Doppler flow. No suspicious
solid mass or abnormal acoustic shadowing is identified.

LEFT: No findings suspicious for malignancy.  Minimal gynecomastia.

On correlative physical examination, there is a mobile palpable
superficial 1 cm mass in the inner RIGHT breast corresponding to
what the patient is feeling.
IMPRESSION: 1. No mammographic or sonographic evidence of malignancy involving
the RIGHT breast.
2. No mammographic evidence of malignancy involving the LEFT breast.
3. Benign 1.2 cm lipoma versus fat necrosis involving the inner LEFT
breast which accounts for the palpable concern.
4. Symmetric minimal BILATERAL gynecomastia.

RECOMMENDATION:
No further imaging follow-up is felt necessary unless there are
persistent or subsequent clinical concerns.

I have discussed the findings and recommendations with the patient.

BI-RADS CATEGORY  2: Benign.

## 2022-07-29 ENCOUNTER — Other Ambulatory Visit (HOSPITAL_COMMUNITY): Payer: Medicare Other

## 2022-08-20 ENCOUNTER — Ambulatory Visit (HOSPITAL_COMMUNITY): Payer: Medicare Other | Attending: Cardiovascular Disease

## 2022-08-20 DIAGNOSIS — I712 Thoracic aortic aneurysm, without rupture, unspecified: Secondary | ICD-10-CM

## 2022-08-20 LAB — ECHOCARDIOGRAM COMPLETE
Area-P 1/2: 3.48 cm2
S' Lateral: 2.9 cm

## 2022-09-11 ENCOUNTER — Ambulatory Visit (INDEPENDENT_AMBULATORY_CARE_PROVIDER_SITE_OTHER): Payer: Medicare Other | Admitting: Family Medicine

## 2022-09-11 ENCOUNTER — Encounter: Payer: Self-pay | Admitting: Family Medicine

## 2022-09-11 VITALS — BP 127/83 | Ht 72.0 in | Wt 258.6 lb

## 2022-09-11 DIAGNOSIS — E782 Mixed hyperlipidemia: Secondary | ICD-10-CM

## 2022-09-11 DIAGNOSIS — Z130 Encounter for screening for diseases of the blood and blood-forming organs and certain disorders involving the immune mechanism: Secondary | ICD-10-CM

## 2022-09-11 DIAGNOSIS — Z0001 Encounter for general adult medical examination with abnormal findings: Secondary | ICD-10-CM | POA: Diagnosis not present

## 2022-09-11 DIAGNOSIS — Z125 Encounter for screening for malignant neoplasm of prostate: Secondary | ICD-10-CM

## 2022-09-11 DIAGNOSIS — I2510 Atherosclerotic heart disease of native coronary artery without angina pectoris: Secondary | ICD-10-CM

## 2022-09-11 DIAGNOSIS — Z0000 Encounter for general adult medical examination without abnormal findings: Secondary | ICD-10-CM

## 2022-09-11 NOTE — Assessment & Plan Note (Signed)
Patient is doing well.  Preventative health care updated today.  Advised that he can get pneumonia vaccine and shingles vaccine at the pharmacy if he desires. Labs today.  Patient will continue his current medications. Blood pressure is well-controlled.  Lipids have been well-controlled as well.

## 2022-09-11 NOTE — Patient Instructions (Addendum)
You can get the pneumonia vaccine and the shingles vaccine at the pharmacy if you like.  Labs today.  Follow up annually or sooner if needed.

## 2022-09-11 NOTE — Progress Notes (Signed)
Subjective:  Patient ID: William Strickland, male    DOB: 1956-03-30  Age: 67 y.o. MRN: [id number]  CC: Chief Complaint  Patient presents with   Annual Exam    No problems or concerns- sees cardiology for irregular heartbeat- on metoprolol 2 times a day and stopped caffeine and doing better    HPI:  67 year old male with CAD, ED, HLD presents for an annual physical exam.  Patient states that he is doing well.  He states that he is recently had a reported irregular heartbeat.  He increased his metoprolol and reached out to his cardiologist.  He states that he is now doing well.  Denies chest pain or shortness of breath.  In regards to his preventative health care, I am unsure the status of his pneumonia vaccine and shingles vaccine.  I have advised him that he can get these at his pharmacy.  He states that he is not taking anymore COVID vaccines.  Patient needs labs today.  Patient Active Problem List   Diagnosis Date Noted   Annual physical exam 09/10/2021   Erectile dysfunction due to arterial insufficiency 08/29/2016   Coronary artery disease, 2004 CABG, cath 07/08/13 with patent grafts 02/02/2013   Hyperlipidemia 02/02/2013    Social Hx   Social History   Socioeconomic History   Marital status: Married    Spouse name: [name]   Number of children: 3   Years of education: Not on file   Highest education level: Not on file  Occupational History   Not on file  Tobacco Use   Smoking status: Never   Smokeless tobacco: Never  Substance and Sexual Activity   Alcohol use: No   Drug use: No   Sexual activity: Yes    Partners: Male  Other Topics Concern   Not on file  Social History Narrative   ** Merged History Encounter **       Re-married 2018. Widowed by first wife.    3 daughters.    Social Determinants of Health   Financial Resource Strain: Low Risk  (10/08/2021)   Overall Financial Resource Strain (CARDIA)    Difficulty of Paying Living Expenses: Not hard  at all  Food Insecurity: No Food Insecurity (10/08/2021)   Hunger Vital Sign    Worried About Running Out of Food in the Last Year: Never true    Ran Out of Food in the Last Year: Never true  Transportation Needs: No Transportation Needs (10/08/2021)   PRAPARE - Hydrologist (Medical): No    Lack of Transportation (Non-Medical): No  Physical Activity: Inactive (10/08/2021)   Exercise Vital Sign    Days of Exercise per Week: 0 days    Minutes of Exercise per Session: 0 min  Stress: No Stress Concern Present (10/08/2021)   [place]    Feeling of Stress : Not at all  Social Connections: [place] (10/08/2021)   Social Connection and Isolation Panel [NHANES]    Frequency of Communication with Friends and Family: More than three times a week    Frequency of Social Gatherings with Friends and Family: More than three times a week    Attends Religious Services: More than 4 times per year    Active Member of [employer] or Organizations: Yes    Attends Music therapist: More than 4 times per year    Marital Status: Married    Review of Systems  Per HPI  Objective:  BP 127/83   Ht 6' (1.829 m)   Wt 258 lb 9.6 oz (117.3 kg)   BMI 35.07 kg/m      09/11/2022    9:00 AM 11/15/2021    9:57 AM 10/08/2021    9:02 AM  BP/Weight  Systolic BP [id number]  Diastolic BP 83 76 82  Wt. (Lbs) 258.6 251 257.4  BMI 35.07 kg/m2 34.04 kg/m2 36.93 kg/m2    Physical Exam Vitals and nursing note reviewed.  Constitutional:      General: He is not in acute distress.    Appearance: Normal appearance.  HENT:     Head: Normocephalic and atraumatic.  Eyes:     General:        Right eye: No discharge.        Left eye: No discharge.     Conjunctiva/sclera: Conjunctivae normal.  Cardiovascular:     Rate and Rhythm: Normal rate and regular rhythm.  Pulmonary:     Effort: Pulmonary effort is  normal.     Breath sounds: Normal breath sounds. No wheezing, rhonchi or rales.  Neurological:     Mental Status: He is alert.  Psychiatric:        Mood and Affect: Mood normal.        Behavior: Behavior normal.     Lab Results  Component Value Date   WBC 4.9 09/06/2021   HGB 14.5 09/06/2021   HCT 44.3 09/06/2021   PLT 293 09/06/2021   GLUCOSE 91 09/06/2021   CHOL 97 (L) 09/06/2021   TRIG 74 09/06/2021   HDL 35 (L) 09/06/2021   LDLCALC 46 09/06/2021   ALT 20 09/06/2021   AST 19 09/06/2021   NA 139 09/06/2021   K 4.5 09/06/2021   CL 103 09/06/2021   CREATININE 0.98 09/06/2021   BUN 13 09/06/2021   CO2 24 09/06/2021   TSH 1.290 08/24/2020   INR 0.97 07/18/2014   HGBA1C 6.1 (H) 07/08/2013     Assessment & Plan:   Problem List Items Addressed This Visit       Cardiovascular and Mediastinum   Coronary artery disease, 2004 CABG, cath 07/08/13 with patent grafts (Chronic)   Relevant Orders   CMP14+EGFR     Other   Hyperlipidemia   Relevant Orders   Lipid panel   Annual physical exam - Primary    Patient is doing well.  Preventative health care updated today.  Advised that he can get pneumonia vaccine and shingles vaccine at the pharmacy if he desires. Labs today.  Patient will continue his current medications. Blood pressure is well-controlled.  Lipids have been well-controlled as well.      Other Visit Diagnoses     Screening for deficiency anemia       Relevant Orders   CBC   Prostate cancer screening       Relevant Orders   PSA      Follow-up:  Return in about 1 year (around 09/12/2023).  [place]

## 2022-09-12 ENCOUNTER — Other Ambulatory Visit: Payer: Self-pay

## 2022-09-12 DIAGNOSIS — Z130 Encounter for screening for diseases of the blood and blood-forming organs and certain disorders involving the immune mechanism: Secondary | ICD-10-CM

## 2022-09-12 LAB — LIPID PANEL
Chol/HDL Ratio: 3.4 ratio (ref 0.0–5.0)
Cholesterol, Total: 116 mg/dL (ref 100–199)
HDL: 34 mg/dL — ABNORMAL LOW (ref >39)
LDL Chol Calc (NIH): 61 mg/dL (ref 0–99)
Triglycerides: 111 mg/dL (ref 0–149)
VLDL Cholesterol Cal: 21 mg/dL (ref 5–40)

## 2022-09-12 LAB — CBC
Hematocrit: 45 % (ref 37.5–51.0)
Hemoglobin: 14.9 g/dL (ref 13.0–17.7)
MCH: 26.4 pg — ABNORMAL LOW (ref 26.6–33.0)
MCHC: 33.1 g/dL (ref 31.5–35.7)
MCV: 80 fL (ref 79–97)
Platelets: 288 10*3/uL (ref 150–450)
RBC: 5.64 x10E6/uL (ref 4.14–5.80)
RDW: 12.9 % (ref 11.6–15.4)
WBC: 5.5 10*3/uL (ref 3.4–10.8)

## 2022-09-12 LAB — CMP14+EGFR
ALT: 26 IU/L (ref 0–44)
AST: 21 IU/L (ref 0–40)
Albumin/Globulin Ratio: 1.9 (ref 1.2–2.2)
Albumin: 4.5 g/dL (ref 3.9–4.9)
Alkaline Phosphatase: 101 IU/L (ref 44–121)
BUN/Creatinine Ratio: 14 (ref 10–24)
BUN: 12 mg/dL (ref 8–27)
Bilirubin Total: 0.6 mg/dL (ref 0.0–1.2)
CO2: 23 mmol/L (ref 20–29)
Calcium: 9.2 mg/dL (ref 8.6–10.2)
Chloride: 101 mmol/L (ref 96–106)
Creatinine, Ser: 0.84 mg/dL (ref 0.76–1.27)
Globulin, Total: 2.4 g/dL (ref 1.5–4.5)
Glucose: 97 mg/dL (ref 70–99)
Potassium: 4.9 mmol/L (ref 3.5–5.2)
Sodium: 140 mmol/L (ref 134–144)
Total Protein: 6.9 g/dL (ref 6.0–8.5)
eGFR: 96 mL/min/{1.73_m2} (ref >59)

## 2022-09-12 LAB — PSA: Prostate Specific Ag, Serum: 1.6 ng/mL (ref 0.0–4.0)

## 2022-09-26 DIAGNOSIS — R718 Other abnormality of red blood cells: Secondary | ICD-10-CM | POA: Diagnosis not present

## 2022-09-26 DIAGNOSIS — Z130 Encounter for screening for diseases of the blood and blood-forming organs and certain disorders involving the immune mechanism: Secondary | ICD-10-CM | POA: Diagnosis not present

## 2022-09-27 LAB — IRON,TIBC AND FERRITIN PANEL
Ferritin: 118 ng/mL (ref 30–400)
Iron Saturation: 35 % (ref 15–55)
Iron: 114 ug/dL (ref 38–169)
Total Iron Binding Capacity: 325 ug/dL (ref 250–450)
UIBC: 211 ug/dL (ref 111–343)

## 2022-10-05 ENCOUNTER — Other Ambulatory Visit: Payer: Self-pay

## 2022-10-05 ENCOUNTER — Encounter: Payer: Self-pay | Admitting: Emergency Medicine

## 2022-10-05 ENCOUNTER — Ambulatory Visit
Admission: EM | Admit: 2022-10-05 | Discharge: 2022-10-05 | Disposition: A | Discharge status: Home / Self Care | Payer: Medicare Other | Attending: Family Medicine | Admitting: Family Medicine

## 2022-10-05 DIAGNOSIS — R112 Nausea with vomiting, unspecified: Secondary | ICD-10-CM

## 2022-10-05 DIAGNOSIS — R197 Diarrhea, unspecified: Secondary | ICD-10-CM

## 2022-10-05 MED ORDER — ONDANSETRON 4 MG PO TBDP: 4.0000 mg | ORAL_TABLET | Freq: Three times a day (TID) | ORAL | 0 refills | Status: DC | PRN

## 2022-10-05 MED ORDER — ONDANSETRON 8 MG PO TBDP
8.0000 mg | ORAL_TABLET | Freq: Once | ORAL | Status: AC
  Administered 2022-10-05: 8 mg via ORAL

## 2022-10-05 NOTE — ED Triage Notes (Signed)
Pt reports emesis, abd pain, insomnia, diarrhea since Saturday. Denies any known fevers and reports unable to take medication x3 days.

## 2022-10-05 NOTE — ED Provider Notes (Signed)
[facility]    CSN: [id number] Arrival date & time: 10/05/22  1421      History   Chief Complaint Chief Complaint  Patient presents with   Emesis    HPI William Strickland is a 67 y.o. male.   Patient presenting today with 3-day history of nausea, vomiting, abdominal pain, fatigue.  Denies fever, chills, melena, hematemesis, upper respiratory symptoms, new foods, new medications, recent travel, known sick contacts with similar symptoms.  So far trying over-the-counter motion sickness medications with no relief of symptoms.  Was able to tolerate a few sips of Gatorade earlier but otherwise not tolerating p.o. very well.    Past Medical History:  Diagnosis Date   CAD (coronary artery disease)    hx CABG 2004, last cath 06/2013 with patent grafts   History of nuclear stress test 05/31/2010; 2013   non-ischemic ; normal perfusion   Hyperlipidemia     Patient Active Problem List   Diagnosis Date Noted   Annual physical exam 09/10/2021   Erectile dysfunction due to arterial insufficiency 08/29/2016   Coronary artery disease, 2004 CABG, cath 07/08/13 with patent grafts 02/02/2013   Hyperlipidemia 02/02/2013    Past Surgical History:  Procedure Laterality Date   CARDIAC CATHETERIZATION  06/2013   Patent grafts   COLONOSCOPY N/A 09/03/2016   Procedure: COLONOSCOPY;  Surgeon: Daneil Dolin, MD;  Location: [facility];  Service: Endoscopy;  Laterality: N/A;  9:00 AM   CORONARY ARTERY BYPASS GRAFT  2004   LIMA to LAD, LIMA to OM, vein to a diagonal branch, sequential vein to PDA and PLA   LEFT HEART CATHETERIZATION WITH CORONARY ANGIOGRAM N/A 07/08/2013   Procedure: LEFT HEART CATHETERIZATION WITH CORONARY ANGIOGRAM;  Surgeon: Pixie Casino, MD;  Location: [facility] CATH LAB;  Service: Cardiovascular;  Laterality: N/A;   SHOULDER ARTHROSCOPY WITH SUBACROMIAL DECOMPRESSION, ROTATOR CUFF REPAIR AND BICEP TENDON REPAIR Left 07/27/2014   Procedure: LEFT SHOULDER ARTHROSCOPY  WITH SUBACROMIAL DECOMPRESSION, DISTAL CLAVICLE RESECTION, ROTATOR CUFF REPAIR AND POSSIBLE BICEP TENODESIS;  Surgeon: Marin Shutter, MD;  Location: [place];  Service: Orthopedics;  Laterality: Left;       Home Medications    Prior to Admission medications   Medication Sig Start Date End Date Taking? Authorizing Provider  ondansetron (ZOFRAN-ODT) 4 MG disintegrating tablet Take 1 tablet (4 mg total) by mouth every 8 (eight) hours as needed for nausea or vomiting. 10/05/22  Yes Volney American, PA-C  aspirin 81 MG tablet Take 81 mg by mouth daily.    [provider]  atorvastatin (LIPITOR) 80 MG tablet TAKE 1 TABLET(80 MG) BY MOUTH DAILY 02/03/22   Lorretta Harp, MD  metoprolol tartrate (LOPRESSOR) 50 MG tablet Take 1 tablet (50 mg total) by mouth 2 (two) times daily. 11/15/21   Lorretta Harp, MD  nitroGLYCERIN (NITROSTAT) 0.4 MG SL tablet PLACE ONE TABLET UNDER THE TONGUE IF NEEDED EVERY 5 MINUTES FOR CHEST PAIN 09/10/21   Coral Spikes, DO  sildenafil (REVATIO) 20 MG tablet Take 3-5 tablets as needed prior to intercourse. Patient not taking: Reported on 10/05/2022 09/10/21   Coral Spikes, DO    Family History Family History  Problem Relation Age of Onset   Heart attack Mother    Heart disease Mother    Hypertension Mother    Cancer Mother    Heart disease Father    Heart attack Brother 65   CAD Brother    CAD Other  Hyperlipidemia Other    Prostate cancer Brother     Social History Social History   Tobacco Use   Smoking status: Never   Smokeless tobacco: Never  Substance Use Topics   Alcohol use: No   Drug use: No     Allergies   Patient has no known allergies.   Review of Systems Review of Systems Per HPI  Physical Exam Triage Vital Signs ED Triage Vitals [10/05/22 1441]  Enc Vitals Group     BP (!) 158/91     Pulse Rate 95     Resp 20     Temp 98 F (36.7 C)     Temp Source Oral     SpO2 97 %     Weight      Height      Head  Circumference      Peak Flow      Pain Score 5     Pain Loc      Pain Edu?      Excl. in [place]?    No data found.  Updated Vital Signs BP (!) 158/91 (BP Location: Right Arm)   Pulse 95   Temp 98 F (36.7 C) (Oral)   Resp 20   SpO2 97%   Visual Acuity Right Eye Distance:   Left Eye Distance:   Bilateral Distance:    Right Eye Near:   Left Eye Near:    Bilateral Near:     Physical Exam Vitals and nursing note reviewed.  Constitutional:      Appearance: Normal appearance.  HENT:     Head: Atraumatic.     Mouth/Throat:     Mouth: Mucous membranes are moist.     Pharynx: Oropharynx is clear.  Eyes:     Extraocular Movements: Extraocular movements intact.     Conjunctiva/sclera: Conjunctivae normal.  Cardiovascular:     Rate and Rhythm: Normal rate and regular rhythm.  Pulmonary:     Effort: Pulmonary effort is normal.     Breath sounds: Normal breath sounds.  Abdominal:     General: Bowel sounds are normal. There is no distension.     Palpations: Abdomen is soft.     Tenderness: There is abdominal tenderness. There is no right CVA tenderness, left CVA tenderness or guarding.     Comments: Minimal epigastric tenderness to palpation without distention or guarding  Musculoskeletal:        General: Normal range of motion.     Cervical back: Normal range of motion and neck supple.  Skin:    General: Skin is warm and dry.  Neurological:     General: No focal deficit present.     Mental Status: He is oriented to person, place, and time.  Psychiatric:        Mood and Affect: Mood normal.        Thought Content: Thought content normal.        Judgment: Judgment normal.      UC Treatments / Results  Labs (all labs ordered are listed, but only abnormal results are displayed) Labs Reviewed - No data to display  EKG   Radiology No results found.  Procedures Procedures (including critical care time)  Medications Ordered in UC Medications  ondansetron  (ZOFRAN-ODT) disintegrating tablet 8 mg (8 mg Oral Given 10/05/22 1544)    Initial Impression / Assessment and Plan / UC Course  I have reviewed the triage vital signs and the nursing notes.  Pertinent labs & imaging  results that were available during my care of the patient were reviewed by me and considered in my medical decision making (see chart for details).     Vitals and exam overall reassuring today, suspect viral GI illness.  Zofran given in clinic for active nausea, treat with Zofran, brat diet, fluids and close monitoring for improvement.  ED for worsening symptoms at any time.  Final Clinical Impressions(s) / UC Diagnoses   Final diagnoses:  Nausea vomiting and diarrhea   Discharge Instructions   None    ED Prescriptions     Medication Sig Dispense Auth. Provider   ondansetron (ZOFRAN-ODT) 4 MG disintegrating tablet Take 1 tablet (4 mg total) by mouth every 8 (eight) hours as needed for nausea or vomiting. 20 tablet Volney American, [place]      PDMP not reviewed this encounter.   Volney American, [place] 10/05/22 1546

## 2022-10-14 ENCOUNTER — Ambulatory Visit: Payer: Self-pay

## 2022-10-17 ENCOUNTER — Ambulatory Visit (INDEPENDENT_AMBULATORY_CARE_PROVIDER_SITE_OTHER): Payer: Medicare Other

## 2022-10-17 VITALS — BP 126/64 | Ht 72.0 in | Wt 249.0 lb

## 2022-10-17 DIAGNOSIS — Z0000 Encounter for general adult medical examination without abnormal findings: Secondary | ICD-10-CM

## 2022-10-17 NOTE — Patient Instructions (Addendum)
William Strickland , Thank you for taking time to come for your Medicare Wellness Visit. I appreciate your ongoing commitment to your health goals. Please review the following plan we discussed and let me know if I can assist you in the future.   These are the goals we discussed:  Goals      Weight (lb) < 200 lb (90.7 kg)     Increase exercise.         This is a list of the screening recommended for you and due dates:  Health Maintenance  Topic Date Due   Zoster (Shingles) Vaccine (1 of 2) Never done   Flu Shot  10/26/2022*   Medicare Annual Wellness Visit  10/17/2023   Colon Cancer Screening  09/03/2026   DTaP/Tdap/Td vaccine (2 - Td or Tdap) 03/16/2028   Pneumonia Vaccine  Completed   HPV Vaccine  Aged Out   COVID-19 Vaccine  Discontinued   Hepatitis C Screening: USPSTF Recommendation to screen - Ages 67-79 yo.  Discontinued  *Topic was postponed. The date shown is not the original due date.    Advanced directives: Please bring a copy of your health care power of attorney and living will to the office to be added to your chart at your convenience.   Conditions/risks identified: Aim for 30 minutes of exercise or brisk walking, 6-8 glasses of water, and 5 servings of fruits and vegetables each day.   Next appointment: Follow up in one year for your annual wellness visit.   Preventive Care 51 Years and Older, Male  Preventive care refers to lifestyle choices and visits with your health care provider that can promote health and wellness. What does preventive care include? A yearly physical exam. This is also called an annual well check. Dental exams once or twice a year. Routine eye exams. Ask your health care provider how often you should have your eyes checked. Personal lifestyle choices, including: Daily care of your teeth and gums. Regular physical activity. Eating a healthy diet. Avoiding tobacco and drug use. Limiting alcohol use. Practicing safe sex. Taking low doses of  aspirin every day. Taking vitamin and mineral supplements as recommended by your health care provider. What happens during an annual well check? The services and screenings done by your health care provider during your annual well check will depend on your age, overall health, lifestyle risk factors, and family history of disease. Counseling  Your health care provider may ask you questions about your: Alcohol use. Tobacco use. Drug use. Emotional well-being. Home and relationship well-being. Sexual activity. Eating habits. History of falls. Memory and ability to understand (cognition). Work and work Statistician. Screening  You may have the following tests or measurements: Height, weight, and BMI. Blood pressure. Lipid and cholesterol levels. These may be checked every 5 years, or more frequently if you are over 32 years old. Skin check. Lung cancer screening. You may have this screening every year starting at age 56 if you have a 30-pack-year history of smoking and currently smoke or have quit within the past 15 years. Fecal occult blood test (FOBT) of the stool. You may have this test every year starting at age 76. Flexible sigmoidoscopy or colonoscopy. You may have a sigmoidoscopy every 5 years or a colonoscopy every 10 years starting at age 66. Prostate cancer screening. Recommendations will vary depending on your family history and other risks. Hepatitis C blood test. Hepatitis B blood test. Sexually transmitted disease (STD) testing. Diabetes screening. This is done by  checking your blood sugar (glucose) after you have not eaten for a while (fasting). You may have this done every 1-3 years. Abdominal aortic aneurysm (AAA) screening. You may need this if you are a current or former smoker. Osteoporosis. You may be screened starting at age 72 if you are at high risk. Talk with your health care provider about your test results, treatment options, and if necessary, the need for more  tests. Vaccines  Your health care provider may recommend certain vaccines, such as: Influenza vaccine. This is recommended every year. Tetanus, diphtheria, and acellular pertussis (Tdap, Td) vaccine. You may need a Td booster every 10 years. Zoster vaccine. You may need this after age 28. Pneumococcal 13-valent conjugate (PCV13) vaccine. One dose is recommended after age 21. Pneumococcal polysaccharide (PPSV23) vaccine. One dose is recommended after age 28. Talk to your health care provider about which screenings and vaccines you need and how often you need them. This information is not intended to replace advice given to you by your health care provider. Make sure you discuss any questions you have with your health care provider. Document Released: 08/10/2015 Document Revised: 04/02/2016 Document Reviewed: 05/15/2015 Elsevier Interactive Patient Education  2017 [employer] Prevention in the Home Falls can cause injuries. They can happen to people of all ages. There are many things you can do to make your home safe and to help prevent falls. What can I do on the outside of my home? Regularly fix the edges of walkways and driveways and fix any cracks. Remove anything that might make you trip as you walk through a door, such as a raised step or threshold. Trim any bushes or trees on the path to your home. Use bright outdoor lighting. Clear any walking paths of anything that might make someone trip, such as rocks or tools. Regularly check to see if handrails are loose or broken. Make sure that both sides of any steps have handrails. Any raised decks and porches should have guardrails on the edges. Have any leaves, snow, or ice cleared regularly. Use sand or salt on walking paths during winter. Clean up any spills in your garage right away. This includes oil or grease spills. What can I do in the bathroom? Use night lights. Install grab bars by the toilet and in the tub and shower.  Do not use towel bars as grab bars. Use non-skid mats or decals in the tub or shower. If you need to sit down in the shower, use a plastic, non-slip stool. Keep the floor dry. Clean up any water that spills on the floor as soon as it happens. Remove soap buildup in the tub or shower regularly. Attach bath mats securely with double-sided non-slip rug tape. Do not have throw rugs and other things on the floor that can make you trip. What can I do in the bedroom? Use night lights. Make sure that you have a light by your bed that is easy to reach. Do not use any sheets or blankets that are too big for your bed. They should not hang down onto the floor. Have a firm chair that has side arms. You can use this for support while you get dressed. Do not have throw rugs and other things on the floor that can make you trip. What can I do in the kitchen? Clean up any spills right away. Avoid walking on wet floors. Keep items that you use a lot in easy-to-reach places. If you need to reach  something above you, use a strong step stool that has a grab bar. Keep electrical cords out of the way. Do not use floor polish or wax that makes floors slippery. If you must use wax, use non-skid floor wax. Do not have throw rugs and other things on the floor that can make you trip. What can I do with my stairs? Do not leave any items on the stairs. Make sure that there are handrails on both sides of the stairs and use them. Fix handrails that are broken or loose. Make sure that handrails are as long as the stairways. Check any carpeting to make sure that it is firmly attached to the stairs. Fix any carpet that is loose or worn. Avoid having throw rugs at the top or bottom of the stairs. If you do have throw rugs, attach them to the floor with carpet tape. Make sure that you have a light switch at the top of the stairs and the bottom of the stairs. If you do not have them, ask someone to add them for you. What else  can I do to help prevent falls? Wear shoes that: Do not have high heels. Have rubber bottoms. Are comfortable and fit you well. Are closed at the toe. Do not wear sandals. If you use a stepladder: Make sure that it is fully opened. Do not climb a closed stepladder. Make sure that both sides of the stepladder are locked into place. Ask someone to hold it for you, if possible. Clearly mark and make sure that you can see: Any grab bars or handrails. First and last steps. Where the edge of each step is. Use tools that help you move around (mobility aids) if they are needed. These include: Canes. Walkers. Scooters. Crutches. Turn on the lights when you go into a dark area. Replace any light bulbs as soon as they burn out. Set up your furniture so you have a clear path. Avoid moving your furniture around. If any of your floors are uneven, fix them. If there are any pets around you, be aware of where they are. Review your medicines with your doctor. Some medicines can make you feel dizzy. This can increase your chance of falling. Ask your doctor what other things that you can do to help prevent falls. This information is not intended to replace advice given to you by your health care provider. Make sure you discuss any questions you have with your health care provider. Document Released: 05/10/2009 Document Revised: 12/20/2015 Document Reviewed: 08/18/2014 Elsevier Interactive Patient Education  2017 [employer].

## 2022-10-17 NOTE — Progress Notes (Signed)
Subjective:   William Strickland is a 67 y.o. male who presents for Medicare Annual/Subsequent preventive examination.  Review of Systems     Cardiac Risk Factors include: advanced age (>32men, >76 women);dyslipidemia;hypertension;male gender     Objective:    Today's Vitals   10/17/22 0846  BP: 126/64  Weight: 249 lb (112.9 kg)  Height: 6' (1.829 m)   Body mass index is 33.77 kg/m.     10/17/2022    9:07 AM 10/08/2021    9:15 AM 09/03/2016    8:22 AM 05/22/2016    9:55 PM 07/18/2014   10:21 AM 07/08/2013    2:46 PM  Advanced Directives  Does Patient Have a Medical Advance Directive? Yes Yes Yes No Yes Patient does not have advance directive  Type of Advance Directive Living will Living will Living will  Desha;Living will   Does patient want to make changes to medical advance directive? No - Patient declined         Current Medications (verified) Outpatient Encounter Medications as of 10/17/2022  Medication Sig   aspirin 81 MG tablet Take 81 mg by mouth daily.   atorvastatin (LIPITOR) 80 MG tablet TAKE 1 TABLET(80 MG) BY MOUTH DAILY   metoprolol tartrate (LOPRESSOR) 50 MG tablet Take 1 tablet (50 mg total) by mouth 2 (two) times daily.   nitroGLYCERIN (NITROSTAT) 0.4 MG SL tablet PLACE ONE TABLET UNDER THE TONGUE IF NEEDED EVERY 5 MINUTES FOR CHEST PAIN   sildenafil (REVATIO) 20 MG tablet Take 3-5 tablets as needed prior to intercourse.   [DISCONTINUED] ondansetron (ZOFRAN-ODT) 4 MG disintegrating tablet Take 1 tablet (4 mg total) by mouth every 8 (eight) hours as needed for nausea or vomiting.   No facility-administered encounter medications on file as of 10/17/2022.    Allergies (verified) Patient has no known allergies.   History: Past Medical History:  Diagnosis Date   CAD (coronary artery disease)    hx CABG 2004, last cath 06/2013 with patent grafts   History of nuclear stress test 05/31/2010; 2013   non-ischemic ; normal perfusion    Hyperlipidemia    Past Surgical History:  Procedure Laterality Date   CARDIAC CATHETERIZATION  06/2013   Patent grafts   COLONOSCOPY N/A 09/03/2016   Procedure: COLONOSCOPY;  Surgeon: Daneil Dolin, MD;  [facility];  Service: Endoscopy;  Laterality: N/A;  9:00 AM   CORONARY ARTERY BYPASS GRAFT  2004   LIMA to LAD, LIMA to OM, vein to a diagonal branch, sequential vein to PDA and PLA   LEFT HEART CATHETERIZATION WITH CORONARY ANGIOGRAM N/A 07/08/2013   Procedure: LEFT HEART CATHETERIZATION WITH CORONARY ANGIOGRAM;  Surgeon: Pixie Casino, MD;  Location: [facility] CATH LAB;  Service: Cardiovascular;  Laterality: N/A;   SHOULDER ARTHROSCOPY WITH SUBACROMIAL DECOMPRESSION, ROTATOR CUFF REPAIR AND BICEP TENDON REPAIR Left 07/27/2014   Procedure: LEFT SHOULDER ARTHROSCOPY WITH SUBACROMIAL DECOMPRESSION, DISTAL CLAVICLE RESECTION, ROTATOR CUFF REPAIR AND POSSIBLE BICEP TENODESIS;  Surgeon: Marin Shutter, MD;  Location: [facility];  Service: Orthopedics;  Laterality: Left;   Family History  Problem Relation Age of Onset   Heart attack Mother    Heart disease Mother    Hypertension Mother    Cancer Mother    Heart disease Father    Heart attack Brother 51   CAD Brother    CAD Other    Hyperlipidemia Other    Prostate cancer Brother    Social History   Socioeconomic History  Marital status: Married    Spouse name: [name]   Number of children: 3   Years of education: Not on file   Highest education level: Not on file  Occupational History   Not on file  Tobacco Use   Smoking status: Never   Smokeless tobacco: Never  Substance and Sexual Activity   Alcohol use: No   Drug use: No   Sexual activity: Yes    Partners: Male  Other Topics Concern   Not on file  Social History Narrative   ** Merged History Encounter **       Re-married 2018. Widowed by first wife.    3 daughters.    Social Determinants of Health   Financial Resource Strain: Low Risk  (10/17/2022)   Overall  Financial Resource Strain (CARDIA)    Difficulty of Paying Living Expenses: Not hard at all  Food Insecurity: No Food Insecurity (10/17/2022)   Hunger Vital Sign    Worried About Running Out of Food in the Last Year: Never true    Ran Out of Food in the Last Year: Never true  Transportation Needs: No Transportation Needs (10/17/2022)   PRAPARE - Hydrologist (Medical): No    Lack of Transportation (Non-Medical): No  Physical Activity: Sufficiently Active (10/17/2022)   Exercise Vital Sign    Days of Exercise per Week: 5 days    Minutes of Exercise per Session: 60 min  Stress: No Stress Concern Present (10/17/2022)   [place]    Feeling of Stress : Not at all  Social Connections: [address] (10/17/2022)   Social Connection and Isolation Panel [NHANES]    Frequency of Communication with Friends and Family: More than three times a week    Frequency of Social Gatherings with Friends and Family: More than three times a week    Attends Religious Services: More than 4 times per year    Active Member of [employer] or Organizations: Yes    Attends Music therapist: More than 4 times per year    Marital Status: Married    Tobacco Counseling Counseling given: Not Answered   Clinical Intake:  Pre-visit preparation completed: Yes  Pain : No/denies pain  Diabetes: No  How often do you need to have someone help you when you read instructions, pamphlets, or other written materials from your doctor or pharmacy?: 1 - Never  Diabetic?No   Interpreter Needed?: No  Information entered by :: Denman George LPN   Activities of Daily Living    10/17/2022    9:07 AM  In your present state of health, do you have any difficulty performing the following activities:  Hearing? 0  Vision? 0  Difficulty concentrating or making decisions? 0  Walking or climbing stairs? 0  Dressing or  bathing? 0  Doing errands, shopping? 0  Preparing Food and eating ? N  Using the Toilet? N  In the past six months, have you accidently leaked urine? N  Do you have problems with loss of bowel control? N  Managing your Medications? N  Managing your Finances? N  Housekeeping or managing your Housekeeping? N    Patient Care Team: Coral Spikes, DO as PCP - General (Family Medicine) Lorretta Harp, MD as PCP - Cardiology (Cardiology)  Indicate any recent Medical Services you may have received from other than [facility] providers in the past year (date may be approximate).  Assessment:   This is a routine wellness examination for Tyjah.  Hearing/Vision screen Hearing Screening - Comments:: Denies hearing difficulties  Vision Screening - Comments:: No vision problems; will schedule routine eye exam soon    Dietary issues and exercise activities discussed: Current Exercise Habits: Home exercise routine, Type of exercise: walking;strength training/weights, Time (Minutes): 60, Frequency (Times/Week): 5, Weekly Exercise (Minutes/Week): 300, Intensity: Moderate   Goals Addressed   None   Depression Screen    10/17/2022    9:05 AM 09/11/2022    9:03 AM 10/08/2021    9:11 AM 09/10/2021    8:59 AM 10/18/2019    9:17 AM 03/16/2018    8:44 AM 03/02/2017    9:27 AM  PHQ 2/9 Scores  PHQ - 2 Score 0 0 0 0 0 0 0    Fall Risk    10/17/2022    9:04 AM 09/11/2022    9:02 AM 10/08/2021    9:17 AM 09/10/2021    8:59 AM 10/18/2019    9:17 AM  Fall Risk   Falls in the past year? 0 0 0 0 0  Number falls in past yr: 0  0 0 0  Injury with Fall? 0  0 0 0  Risk for fall due to : No Fall Risks No Fall Risks No Fall Risks No Fall Risks   Follow up Falls prevention discussed;Education provided;Falls evaluation completed Falls evaluation completed Falls prevention discussed Falls evaluation completed Falls evaluation completed    FALL RISK PREVENTION PERTAINING TO THE HOME:  Any stairs in or around  the home? Yes  If so, are there any without handrails? No  Home free of loose throw rugs in walkways, pet beds, electrical cords, etc? Yes  Adequate lighting in your home to reduce risk of falls? Yes   ASSISTIVE DEVICES UTILIZED TO PREVENT FALLS:  Life alert? No  Use of a cane, walker or w/c? No  Grab bars in the bathroom? Yes  Shower chair or bench in shower? No  Elevated toilet seat or a handicapped toilet? Yes   TIMED UP AND GO:  Was the test performed? Yes .  Length of time to ambulate 10 feet: 5 sec.   Gait steady and fast without use of assistive device  Cognitive Function:        10/17/2022    9:08 AM 10/08/2021    9:21 AM  6CIT Screen  What Year? 0 points 0 points  What month? 0 points 0 points  What time? 0 points 0 points  Count back from 20 0 points 0 points  Months in reverse 0 points 0 points  Repeat phrase 0 points 4 points  Total Score 0 points 4 points    Immunizations Immunization History  Administered Date(s) Administered   Influenza,inj,Quad PF,6+ Mos 07/09/2013, 05/29/2017, 05/12/2018   Influenza-Unspecified 05/29/2017, 06/12/2019   Moderna Sars-Covid-2 Vaccination 10/14/2019, 11/15/2019   PNEUMOCOCCAL CONJUGATE-20 02/16/2022   Tdap 03/16/2018   Zoster, Live 10/26/2010    TDAP status: Up to date  Flu Vaccine status: Declined, Education has been provided regarding the importance of this vaccine but patient still declined. Advised may receive this vaccine at local pharmacy or Health Dept. Aware to provide a copy of the vaccination record if obtained from local pharmacy or Health Dept. Verbalized acceptance and understanding.  Pneumococcal vaccine status: Up to date  Covid-19 vaccine status: Information provided on how to obtain vaccines.   Qualifies for Shingles Vaccine? Yes   Zostavax completed Yes  Shingrix Completed?: No.    Education has been provided regarding the importance of this vaccine. Patient has been advised to call insurance  company to determine out of pocket expense if they have not yet received this vaccine. Advised may also receive vaccine at local pharmacy or Health Dept. Verbalized acceptance and understanding.  Screening Tests Health Maintenance  Topic Date Due   Zoster Vaccines- Shingrix (1 of 2) Never done   INFLUENZA VACCINE  10/26/2022 (Originally 02/25/2022)   Medicare Annual Wellness (AWV)  10/17/2023   COLONOSCOPY (Pts 45-3yrs Insurance coverage will need to be confirmed)  09/03/2026   DTaP/Tdap/Td (2 - Td or Tdap) 03/16/2028   Pneumonia Vaccine 2+ Years old  Completed   HPV VACCINES  Aged Out   COVID-19 Vaccine  Discontinued   Hepatitis C Screening  Discontinued    Health Maintenance  Health Maintenance Due  Topic Date Due   Zoster Vaccines- Shingrix (1 of 2) Never done    Colorectal cancer screening: Type of screening: Colonoscopy. Completed 09/03/16. Repeat every 10 years  Lung Cancer Screening: (Low Dose CT Chest recommended if Age 53-80 years, 30 pack-year currently smoking OR have quit w/in 15years.) does not qualify.   Lung Cancer Screening Referral: n/a  Additional Screening:  Hepatitis C Screening: does qualify;  Vision Screening: Recommended annual ophthalmology exams for early detection of glaucoma and other disorders of the eye. Is the patient up to date with their annual eye exam?  No  Who is the provider or what is the name of the office in which the patient attends annual eye exams? None  If pt is not established with a provider, would they like to be referred to a provider to establish care? No .   Dental Screening: Recommended annual dental exams for proper oral hygiene  Community Resource Referral / Chronic Care Management: CRR required this visit?  No   CCM required this visit?  No      Plan:     I have personally reviewed and noted the following in the patient's chart:   Medical and social history Use of alcohol, tobacco or illicit drugs  Current  medications and supplements including opioid prescriptions. Patient is not currently taking opioid prescriptions. Functional ability and status Nutritional status Physical activity Advanced directives List of other physicians Hospitalizations, surgeries, and ER visits in previous 12 months Vitals Screenings to include cognitive, depression, and falls Referrals and appointments  In addition, I have reviewed and discussed with patient certain preventive protocols, quality metrics, and best practice recommendations. A written personalized care plan for preventive services as well as general preventive health recommendations were provided to patient.     Denman George [place], [place]   [id number]   Nurse Notes: No concerns

## 2022-12-01 ENCOUNTER — Other Ambulatory Visit: Payer: Self-pay | Admitting: Cardiovascular Disease

## 2023-01-19 DIAGNOSIS — M1712 Unilateral primary osteoarthritis, left knee: Secondary | ICD-10-CM | POA: Diagnosis not present

## 2023-02-28 ENCOUNTER — Other Ambulatory Visit: Payer: Self-pay | Admitting: Cardiovascular Disease

## 2023-03-05 ENCOUNTER — Other Ambulatory Visit: Payer: Self-pay | Admitting: Cardiovascular Disease

## 2023-05-13 ENCOUNTER — Ambulatory Visit: Payer: Medicare Other | Attending: Cardiovascular Disease | Admitting: Cardiovascular Disease

## 2023-05-13 ENCOUNTER — Encounter: Payer: Self-pay | Admitting: Cardiovascular Disease

## 2023-05-13 VITALS — BP 122/78 | HR 63 | Ht 72.0 in | Wt 257.8 lb

## 2023-05-13 DIAGNOSIS — I712 Thoracic aortic aneurysm, without rupture, unspecified: Secondary | ICD-10-CM

## 2023-05-13 DIAGNOSIS — E782 Mixed hyperlipidemia: Secondary | ICD-10-CM | POA: Diagnosis not present

## 2023-05-13 DIAGNOSIS — I2510 Atherosclerotic heart disease of native coronary artery without angina pectoris: Secondary | ICD-10-CM | POA: Diagnosis not present

## 2023-05-13 MED ORDER — ATORVASTATIN CALCIUM 80 MG PO TABS: 80.0000 mg | ORAL_TABLET | Freq: Every day | ORAL | 3 refills | Status: DC

## 2023-05-13 MED ORDER — NITROGLYCERIN 0.4 MG SL SUBL: SUBLINGUAL_TABLET | SUBLINGUAL | 3 refills | Status: AC

## 2023-05-13 MED ORDER — METOPROLOL TARTRATE 50 MG PO TABS: 50.0000 mg | ORAL_TABLET | Freq: Two times a day (BID) | ORAL | 3 refills | Status: DC

## 2023-05-13 NOTE — Assessment & Plan Note (Signed)
History of hyperlipidemia on high-dose statin therapy with lipid profile performed 09/11/2022 revealing a total cholesterol 116, LDL of 61 and HDL of 34.

## 2023-05-13 NOTE — Patient Instructions (Signed)
Medication Instructions:  Your physician recommends that you continue on your current medications as directed. Please refer to the Current Medication list given to you today.  *If you need a refill on your cardiac medications before your next appointment, please call your pharmacy*   Testing/Procedures: Your physician has requested that you have an echocardiogram. Echocardiography is a painless test that uses sound waves to create images of your heart. It provides your doctor with information about the size and shape of your heart and how well your heart's chambers and valves are working. This procedure takes approximately one hour. There are no restrictions for this procedure. Please do NOT wear cologne, perfume, aftershave, or lotions (deodorant is allowed). Please arrive 15 minutes prior to your appointment time. This will take place at [address]. **To do in January**    Follow-Up: At [facility], you and your health needs are our priority.  As part of our continuing mission to provide you with exceptional heart care, we have created designated Provider Care Teams.  These Care Teams include your primary Cardiologist (physician) and Advanced Practice Providers (APPs -  Physician Assistants and Nurse Practitioners) who all work together to provide you with the care you need, when you need it.  We recommend signing up for the patient portal called "MyChart".  Sign up information is provided on this After Visit Summary.  MyChart is used to connect with patients for Virtual Visits (Telemedicine).  Patients are able to view lab/test results, encounter notes, upcoming appointments, etc.  Non-urgent messages can be sent to your provider as well.   To learn more about what you can do with MyChart, go to [email or web address].    Your next appointment:   12 month(s)  Provider:   Nanetta Batty, MD

## 2023-05-13 NOTE — Progress Notes (Signed)
05/13/2023 William Strickland   [birth year]  [id number]  Primary Physician Tommie Sams, DO Primary Cardiologist: [name] MD FACP, [place], [place], [place]  HPI:  William Strickland is a 67 y.o.  mildly overweight widowed although remarried 3 years ago Caucasian male father of 3 children, grandfather to 6 grandchildren who I last saw in the office 11/16/2018.  He has a history of CAD status post coronary artery bypass grafting in 2004 with an LIMA graft to the LAD, RIMA graft to an OM branch, a vein to diagonal branch, and a sequential vein to the PDA/PL A. His other problems include hyperlipidemia. His recent lipid profile performed 09/03/2017 revealed an LDL of  47 and an HDL of 35.  He had a nonischemic Myoview  06/04/12, as well as 09/21/2020.   He does get occasional palpitations.  He had an event monitor performed 10/05/2020 that showed occasional PACs, PVCs and short runs of PSVT and nonsustained ventricular tachycardia.  These are improved with his beta-blocker and also avoiding caffeine.  Since I saw him a year and a half ago he continues to do well.  He denies chest pain or shortness of breath.  His major complaint is occasional palpitations which is an old problem.   Current Meds  Medication Sig   aspirin 81 MG tablet Take 81 mg by mouth daily.   atorvastatin (LIPITOR) 80 MG tablet TAKE 1 TABLET(80 MG) BY MOUTH DAILY   metoprolol tartrate (LOPRESSOR) 50 MG tablet Take 1 tablet (50 mg total) by mouth 2 (two) times daily.   nitroGLYCERIN (NITROSTAT) 0.4 MG SL tablet PLACE ONE TABLET UNDER THE TONGUE IF NEEDED EVERY 5 MINUTES FOR CHEST PAIN   sildenafil (REVATIO) 20 MG tablet Take 3-5 tablets as needed prior to intercourse.     No Known Allergies  Social History   Socioeconomic History   Marital status: Married    Spouse name: [name]   Number of children: 3   Years of education: Not on file   Highest education level: Not on file  Occupational History   Not on file   Tobacco Use   Smoking status: Never   Smokeless tobacco: Never  Substance and Sexual Activity   Alcohol use: No   Drug use: No   Sexual activity: Yes    Partners: Male  Other Topics Concern   Not on file  Social History Narrative   ** Merged History Encounter **       Re-married 2018. Widowed by first wife.    3 daughters.    Social Determinants of Health   Financial Resource Strain: Low Risk  (10/17/2022)   Overall Financial Resource Strain (CARDIA)    Difficulty of Paying Living Expenses: Not hard at all  Food Insecurity: No Food Insecurity (10/17/2022)   Hunger Vital Sign    Worried About Running Out of Food in the Last Year: Never true    Ran Out of Food in the Last Year: Never true  Transportation Needs: No Transportation Needs (10/17/2022)   PRAPARE - Administrator, Civil Service (Medical): No    Lack of Transportation (Non-Medical): No  Physical Activity: Sufficiently Active (10/17/2022)   Exercise Vital Sign    Days of Exercise per Week: 5 days    Minutes of Exercise per Session: 60 min  Stress: No Stress Concern Present (10/17/2022)   [employer] of Occupational Health - Occupational Stress Questionnaire    Feeling of Stress : Not at  all  Social Connections: Socially Integrated (10/17/2022)   Social Connection and Isolation Panel [NHANES]    Frequency of Communication with Friends and Family: More than three times a week    Frequency of Social Gatherings with Friends and Family: More than three times a week    Attends Religious Services: More than 4 times per year    Active Member of [employer] or Organizations: Yes    Attends Engineer, structural: More than 4 times per year    Marital Status: Married  Catering manager Violence: Not At Risk (10/17/2022)   Humiliation, Afraid, Rape, and Kick questionnaire    Fear of Current or Ex-Partner: No    Emotionally Abused: No    Physically Abused: No    Sexually Abused: No     Review of  Systems: General: negative for chills, fever, night sweats or weight changes.  Cardiovascular: negative for chest pain, dyspnea on exertion, edema, orthopnea, palpitations, paroxysmal nocturnal dyspnea or shortness of breath Dermatological: negative for rash Respiratory: negative for cough or wheezing Urologic: negative for hematuria Abdominal: negative for nausea, vomiting, diarrhea, bright red blood per rectum, melena, or hematemesis Neurologic: negative for visual changes, syncope, or dizziness All other systems reviewed and are otherwise negative except as noted above.    Blood pressure 122/78, pulse 63, height 6' (1.829 m), weight 257 lb 12.8 oz (116.9 kg), SpO2 96%.  General appearance: alert and no distress Neck: no adenopathy, no carotid bruit, no JVD, supple, symmetrical, trachea midline, and thyroid not enlarged, symmetric, no tenderness/mass/nodules Lungs: clear to auscultation bilaterally Heart: regular rate and rhythm, S1, S2 normal, no murmur, click, rub or gallop Extremities: extremities normal, atraumatic, no cyanosis or edema Pulses: 2+ and symmetric Skin: Skin color, texture, turgor normal. No rashes or lesions Neurologic: Grossly normal  EKG EKG Interpretation Date/Time:  Wednesday May 13 2023 09:45:56 EDT Ventricular Rate:  63 PR Interval:  200 QRS Duration:  80 QT Interval:  420 QTC Calculation: 429 R Axis:   3  Text Interpretation: Normal sinus rhythm Nonspecific ST and T wave abnormality When compared with ECG of 21-Sep-2020 09:57, Vent. rate has decreased BY  89 BPM Questionable change in QRS axis ST no longer depressed in Inferior leads ST no longer depressed in Lateral leads Nonspecific T wave abnormality now evident in Lateral leads Confirmed by Nanetta Batty [id number]) on 05/13/2023 9:51:40 AM    ASSESSMENT AND PLAN:   Coronary artery disease, 2004 CABG, cath 07/08/13 with patent grafts History of CAD status post coronary artery bypass grafting in  2004 with a LIMA to his LAD, RIMA to the OM and vein to diagonal branch as well as sequential vein to the PDA and PLA.  He had a Myoview performed 06/04/2012 as well as 09/21/2020 both of which were normal..  He denies chest pain or shortness of breath.  Hyperlipidemia History of hyperlipidemia on high-dose statin therapy with lipid profile performed 09/11/2022 revealing a total cholesterol 116, LDL of 61 and HDL of 34.     [name] MD FACP,FACC,FAHA, [facility] 05/13/2023 9:58 AM

## 2023-05-13 NOTE — Assessment & Plan Note (Signed)
History of CAD status post coronary artery bypass grafting in 2004 with a LIMA to his LAD, RIMA to the OM and vein to diagonal branch as well as sequential vein to the PDA and PLA.  He had a Myoview performed 06/04/2012 as well as 09/21/2020 both of which were normal..  He denies chest pain or shortness of breath.

## 2023-08-05 ENCOUNTER — Ambulatory Visit (HOSPITAL_COMMUNITY): Payer: Medicare Other | Attending: Cardiovascular Disease

## 2023-08-05 DIAGNOSIS — E782 Mixed hyperlipidemia: Secondary | ICD-10-CM | POA: Insufficient documentation

## 2023-08-05 DIAGNOSIS — I712 Thoracic aortic aneurysm, without rupture, unspecified: Secondary | ICD-10-CM | POA: Diagnosis not present

## 2023-08-05 DIAGNOSIS — I2510 Atherosclerotic heart disease of native coronary artery without angina pectoris: Secondary | ICD-10-CM | POA: Diagnosis not present

## 2023-08-05 LAB — ECHOCARDIOGRAM COMPLETE
Area-P 1/2: 4.19 cm2
S' Lateral: 3.1 cm

## 2023-09-23 DIAGNOSIS — H6122 Impacted cerumen, left ear: Secondary | ICD-10-CM | POA: Diagnosis not present

## 2023-09-23 DIAGNOSIS — H60312 Diffuse otitis externa, left ear: Secondary | ICD-10-CM | POA: Diagnosis not present

## 2023-09-23 DIAGNOSIS — H6121 Impacted cerumen, right ear: Secondary | ICD-10-CM | POA: Diagnosis not present

## 2023-10-23 ENCOUNTER — Ambulatory Visit: Payer: Medicare Other

## 2023-12-28 DIAGNOSIS — Z125 Encounter for screening for malignant neoplasm of prostate: Secondary | ICD-10-CM | POA: Diagnosis not present

## 2023-12-28 DIAGNOSIS — Z8042 Family history of malignant neoplasm of prostate: Secondary | ICD-10-CM | POA: Diagnosis not present

## 2023-12-28 DIAGNOSIS — R002 Palpitations: Secondary | ICD-10-CM | POA: Diagnosis not present

## 2023-12-28 DIAGNOSIS — E785 Hyperlipidemia, unspecified: Secondary | ICD-10-CM | POA: Diagnosis not present

## 2023-12-28 DIAGNOSIS — Z0000 Encounter for general adult medical examination without abnormal findings: Secondary | ICD-10-CM | POA: Diagnosis not present

## 2023-12-28 DIAGNOSIS — Z23 Encounter for immunization: Secondary | ICD-10-CM | POA: Diagnosis not present

## 2023-12-28 LAB — LAB REPORT - SCANNED: EGFR: 95.8

## 2023-12-29 ENCOUNTER — Encounter: Payer: Self-pay | Admitting: Internal Medicine

## 2024-01-03 ENCOUNTER — Ambulatory Visit: Payer: Self-pay | Admitting: Cardiovascular Disease

## 2024-06-03 ENCOUNTER — Other Ambulatory Visit: Payer: Self-pay | Admitting: Cardiovascular Disease

## 2024-06-04 ENCOUNTER — Other Ambulatory Visit: Payer: Self-pay | Admitting: Cardiovascular Disease

## 2024-06-06 ENCOUNTER — Other Ambulatory Visit: Payer: Self-pay | Admitting: Cardiovascular Disease

## 2024-07-16 ENCOUNTER — Other Ambulatory Visit: Payer: Self-pay | Admitting: Cardiovascular Disease

## 2024-08-01 ENCOUNTER — Encounter: Payer: Self-pay | Admitting: Cardiovascular Disease

## 2024-08-01 ENCOUNTER — Ambulatory Visit: Attending: Cardiovascular Disease | Admitting: Cardiovascular Disease

## 2024-08-01 VITALS — BP 142/70 | HR 63 | Ht 72.0 in | Wt 254.0 lb

## 2024-08-01 DIAGNOSIS — I712 Thoracic aortic aneurysm, without rupture, unspecified: Secondary | ICD-10-CM | POA: Insufficient documentation

## 2024-08-01 DIAGNOSIS — R002 Palpitations: Secondary | ICD-10-CM

## 2024-08-01 DIAGNOSIS — I2510 Atherosclerotic heart disease of native coronary artery without angina pectoris: Secondary | ICD-10-CM | POA: Diagnosis not present

## 2024-08-01 DIAGNOSIS — E782 Mixed hyperlipidemia: Secondary | ICD-10-CM

## 2024-08-01 NOTE — Assessment & Plan Note (Signed)
 History of hyperlipidemia on high-dose statin therapy with lipid profile performed 12/28/2023 revealing total cholesterol 101, LDL 47 and HDL 28.

## 2024-08-01 NOTE — Assessment & Plan Note (Signed)
 History of palpitations with event monitoring showing PACs, PVCs and short runs of SVT and NSVT improved on beta-blockade and caffeine avoidance.  This severely not changed in size from a year ago.

## 2024-08-01 NOTE — Assessment & Plan Note (Signed)
 Small ascending thoracic aortic aneurysm which we have been following by 2D echo most recently performed 1/H25 measuring 40 mm at the root and 43 mm at the ascending aorta.  This will be repeated on an annual basis.

## 2024-08-01 NOTE — Assessment & Plan Note (Signed)
 History of CAD status post CABG in 2004 with a LIMA to his LAD, RIMA to an OM and a vein to a diagonal branch as well as a sequential vein to the PDA and PLA.  He had nonischemic Myoview 's in 2013 and 2022 respectively.  He is asymptomatic.

## 2024-08-01 NOTE — Patient Instructions (Signed)
 Medication Instructions:  Your physician recommends that you continue on your current medications as directed. Please refer to the Current Medication list given to you today.  *If you need a refill on your cardiac medications before your next appointment, please call your pharmacy*   Testing/Procedures: Your physician has requested that you have an echocardiogram. Echocardiography is a painless test that uses sound waves to create images of your heart. It provides your doctor with information about the size and shape of your heart and how well your heart's chambers and valves are working. This procedure takes approximately one hour. There are no restrictions for this procedure. Please do NOT wear cologne, perfume, aftershave, or lotions (deodorant is allowed). Please arrive 15 minutes prior to your appointment time.  Please note: We ask at that you not bring children with you during ultrasound (echo/ vascular) testing. Due to room size and safety concerns, children are not allowed in the ultrasound rooms during exams. Our front office staff cannot provide observation of children in our lobby area while testing is being conducted. An adult accompanying a patient to their appointment will only be allowed in the ultrasound room at the discretion of the ultrasound technician under special circumstances. We apologize for any inconvenience.   Follow-Up: At [facility], you and your health needs are our priority.  As part of our continuing mission to provide you with exceptional heart care, our providers are all part of one team.  This team includes your primary Cardiologist (physician) and Advanced Practice Providers or APPs (Physician Assistants and Nurse Practitioners) who all work together to provide you with the care you need, when you need it.  Your next appointment:   12 month(s)  Provider:   Dorn Lesches, MD

## 2024-08-01 NOTE — Progress Notes (Signed)
 "     08/01/2024 William Strickland   [birth year]  [id number]  Primary Physician Larnell Hamilton, MD Primary Cardiologist: Dorn JINNY Lesches MD FACP, [place], [place], FSCAI  HPI:  William Strickland is a 69 y.o.   mildly overweight widowed although remarried 3 years ago Caucasian male father of 3 children, grandfather to 6 grandchildren who I last saw in the office 05/13/2023.  He has a history of CAD status post coronary artery bypass grafting in 2004 with an LIMA graft to the LAD, RIMA graft to an OM branch, a vein to diagonal branch, and a sequential vein to the PDA/PL A. His other problems include hyperlipidemia. His recent lipid profile performed 09/03/2017 revealed an LDL of  47 and an HDL of 35.  He had a nonischemic Myoview   06/04/12, as well as 09/21/2020.   He does get occasional palpitations.  He had an event monitor performed 10/05/2020 that showed occasional PACs, PVCs and short runs of PSVT and nonsustained ventricular tachycardia.  These are improved with his beta-blocker and also avoiding caffeine.   Since I saw him in the office a little over a year ago he continues to do well.  He still gets occasional palpitations which have not changed in frequency or severity.  He denies chest pain or shortness of breath.   Active Medications[1]   Allergies[2]  Social History   Socioeconomic History   Marital status: Married    Spouse name: [name]   Number of children: 3   Years of education: Not on file   Highest education level: Not on file  Occupational History   Not on file  Tobacco Use   Smoking status: Never   Smokeless tobacco: Never  Substance and Sexual Activity   Alcohol use: No   Drug use: No   Sexual activity: Yes    Partners: Male  Other Topics Concern   Not on file  Social History Narrative   ** Merged History Encounter **       Re-married 2018. Widowed by first wife.    3 daughters.    Social Drivers of Health   Tobacco Use: Low Risk (05/13/2023)   Patient History     Smoking Tobacco Use: Never    Smokeless Tobacco Use: Never    Passive Exposure: Not on file  Financial Resource Strain: Low Risk (10/17/2022)   Overall Financial Resource Strain (CARDIA)    Difficulty of Paying Living Expenses: Not hard at all  Food Insecurity: No Food Insecurity (10/17/2022)   Hunger Vital Sign    Worried About Running Out of Food in the Last Year: Never true    Ran Out of Food in the Last Year: Never true  Transportation Needs: No Transportation Needs (10/17/2022)   PRAPARE - Administrator, Civil Service (Medical): No    Lack of Transportation (Non-Medical): No  Physical Activity: Sufficiently Active (10/17/2022)   Exercise Vital Sign    Days of Exercise per Week: 5 days    Minutes of Exercise per Session: 60 min  Stress: No Stress Concern Present (10/17/2022)   [employer] of Occupational Health - Occupational Stress Questionnaire    Feeling of Stress : Not at all  Social Connections: Socially Integrated (10/17/2022)   Social Connection and Isolation Panel    Frequency of Communication with Friends and Family: More than three times a week    Frequency of Social Gatherings with Friends and Family: More than three times a week    Attends  Religious Services: More than 4 times per year    Active Member of Clubs or Organizations: Yes    Attends Club or Organization Meetings: More than 4 times per year    Marital Status: Married  Catering Manager Violence: Not At Risk (10/17/2022)   Humiliation, Afraid, Rape, and Kick questionnaire    Fear of Current or Ex-Partner: No    Emotionally Abused: No    Physically Abused: No    Sexually Abused: No  Depression (PHQ2-9): Low Risk (10/17/2022)   Depression (PHQ2-9)    PHQ-2 Score: 0  Alcohol Screen: Low Risk (10/17/2022)   Alcohol Screen    Last Alcohol Screening Score (AUDIT): 0  Housing: Low Risk (10/17/2022)   Housing    Last Housing Risk Score: 0  Utilities: Not At Risk (10/17/2022)   AHC Utilities     Threatened with loss of utilities: No  Health Literacy: Not on file     Review of Systems: General: negative for chills, fever, night sweats or weight changes.  Cardiovascular: negative for chest pain, dyspnea on exertion, edema, orthopnea, palpitations, paroxysmal nocturnal dyspnea or shortness of breath Dermatological: negative for rash Respiratory: negative for cough or wheezing Urologic: negative for hematuria Abdominal: negative for nausea, vomiting, diarrhea, bright red blood per rectum, melena, or hematemesis Neurologic: negative for visual changes, syncope, or dizziness All other systems reviewed and are otherwise negative except as noted above.    Blood pressure (!) 142/70, pulse 63, height 6' (1.829 m), weight 254 lb (115.2 kg), SpO2 98%.  General appearance: alert and no distress Neck: no adenopathy, no carotid bruit, no JVD, supple, symmetrical, trachea midline, and thyroid  not enlarged, symmetric, no tenderness/mass/nodules Lungs: clear to auscultation bilaterally Heart: regular rate and rhythm, S1, S2 normal, no murmur, click, rub or gallop Extremities: extremities normal, atraumatic, no cyanosis or edema Pulses: 2+ and symmetric Skin: Skin color, texture, turgor normal. No rashes or lesions Neurologic: Grossly normal  EKG EKG Interpretation Date/Time:  Monday August 01 2024 09:00:12 EST Ventricular Rate:  63 PR Interval:  194 QRS Duration:  82 QT Interval:  420 QTC Calculation: 429 R Axis:   -10  Text Interpretation: Normal sinus rhythm Nonspecific ST abnormality When compared with ECG of 13-May-2023 09:45, No significant change was found Confirmed by Court Carrier [id number]) on 08/01/2024 9:02:59 AM    ASSESSMENT AND PLAN:   Coronary artery disease, 2004 CABG, cath 07/08/13 with patent grafts History of CAD status post CABG in 2004 with a LIMA to his LAD, RIMA to an OM and a vein to a diagonal branch as well as a sequential vein to the PDA and PLA.  He had  nonischemic Myoview 's in 2013 and 2022 respectively.  He is asymptomatic.  Hyperlipidemia History of hyperlipidemia on high-dose statin therapy with lipid profile performed 12/28/2023 revealing total cholesterol 101, LDL 47 and HDL 28.  Palpitations History of palpitations with event monitoring showing PACs, PVCs and short runs of SVT and NSVT improved on beta-blockade and caffeine avoidance.  This severely not changed in size from a year ago.  Thoracic aortic aneurysm Small ascending thoracic aortic aneurysm which we have been following by 2D echo most recently performed 1/H25 measuring 40 mm at the root and 43 mm at the ascending aorta.  This will be repeated on an annual basis.     Carrier DOROTHA Court MD FACP,FACC,FAHA, [facility], [place] 08/01/2024 9:16 AM    [1]  Current Meds  Medication Sig   aspirin  81 MG tablet Take 81 mg  by mouth daily.   atorvastatin  (LIPITOR) 80 MG tablet TAKE 1 TABLET(80 MG) BY MOUTH DAILY   metoprolol  tartrate (LOPRESSOR ) 50 MG tablet TAKE 1 TABLET(50 MG) BY MOUTH TWICE DAILY   nitroGLYCERIN  (NITROSTAT ) 0.4 MG SL tablet PLACE ONE TABLET UNDER THE TONGUE IF NEEDED EVERY 5 MINUTES FOR CHEST PAIN   sildenafil  (REVATIO ) 20 MG tablet Take 3-5 tablets as needed prior to intercourse.  [2] No Known Allergies  "

## 2024-08-22 ENCOUNTER — Other Ambulatory Visit: Payer: Self-pay | Admitting: Cardiovascular Disease

## 2024-08-22 DIAGNOSIS — I712 Thoracic aortic aneurysm, without rupture, unspecified: Secondary | ICD-10-CM

## 2024-08-22 DIAGNOSIS — I2510 Atherosclerotic heart disease of native coronary artery without angina pectoris: Secondary | ICD-10-CM

## 2024-08-25 ENCOUNTER — Telehealth (HOSPITAL_BASED_OUTPATIENT_CLINIC_OR_DEPARTMENT_OTHER): Payer: Self-pay

## 2024-08-25 NOTE — Telephone Encounter (Signed)
"  ° °  Pre-operative Risk Assessment    Patient Name: William Strickland  DOB: 1956/03/31 MRN: [id number]   Date of last office visit: 08/01/2024 with Dr. Court Date of next office visit: None  Request for Surgical Clearance    Procedure: Left total knee arthroplasty  Date of Surgery:  Clearance 10/04/24                                 Surgeon:  Dr. Donnice Car  Surgeon's Group or Practice Name:  [facility] Phone number:  [phone] Fax number:  [phone]   Type of Clearance Requested:   - Medical  - Pharmacy:  Hold Aspirin  -does not specify   Type of Anesthesia:  Spinal   Additional requests/questions:  None  SignedPatrcia Iverson CROME   08/25/2024, 11:47 AM   "

## 2024-08-26 ENCOUNTER — Encounter (HOSPITAL_COMMUNITY)

## 2024-08-26 ENCOUNTER — Telehealth: Payer: Self-pay | Admitting: Cardiovascular Disease

## 2024-08-26 NOTE — Telephone Encounter (Signed)
" °*  STAT* If patient is at the pharmacy, call can be transferred to refill team.   1. Which medications need to be refilled? (please list name of each medication and dose if known) atorvastatin  (LIPITOR) 80 MG tablet  metoprolol  tartrate (LOPRESSOR ) 50 MG tablet   2. Would you like to learn more about the convenience, safety, & potential cost savings by using the [facility]?   3. Are you open to using the [facility] (Type [facility].  ).   4. Which pharmacy/location (including street and city if local pharmacy) is medication to be sent to? [facility] [phone] - [place], [place] - [address] AT [facility] [address]    5. Do they need a 30 day or 90 day supply? 90 day    Pt out of medication "

## 2024-08-29 ENCOUNTER — Telehealth: Payer: Self-pay | Admitting: Cardiovascular Disease

## 2024-08-29 NOTE — Telephone Encounter (Signed)
 Refill was sent 08/26/24.

## 2024-08-29 NOTE — Telephone Encounter (Signed)
 Returned call to pt, he has been made aware that we sent in his refills on Friday, 08/26/24.  Pt thanked me for the call back.

## 2024-08-29 NOTE — Telephone Encounter (Signed)
 Pt out of medication  1. Which medications need to be refilled? (please list name of each medication and dose if known) atorvastatin  (LIPITOR) 80 MG tablet  metoprolol  tartrate (LOPRESSOR ) 50 MG tablet   2. Would you like to learn more about the convenience, safety, & potential cost savings by using the [facility]? No    3. Are you open to using the [facility] (Type [facility]. No   4. Which pharmacy/location (including street and city if local pharmacy) is medication to be sent to? [facility] [phone] - [place], [place] - [address] AT [facility] [address]     5. Do they need a 30 day or 90 day supply? 90

## 2024-08-30 ENCOUNTER — Ambulatory Visit: Admit: 2024-08-30 | Admitting: Orthopedic Surgery

## 2024-08-30 SURGERY — ARTHROPLASTY, KNEE, TOTAL
Anesthesia: Spinal | Site: Knee | Laterality: Left

## 2024-10-04 ENCOUNTER — Ambulatory Visit (HOSPITAL_COMMUNITY): Admit: 2024-10-04 | Admitting: Orthopedic Surgery
